# Patient Record
Sex: Male | Born: 1950 | Race: White | Hispanic: No | Marital: Married | Smoking: Former smoker
Health system: Southern US, Community
[De-identification: ages and names within clinical notes are randomized; demographics above are authoritative.]

## PROBLEM LIST (undated history)

## (undated) DIAGNOSIS — R519 Headache, unspecified: Secondary | ICD-10-CM

## (undated) DIAGNOSIS — I739 Peripheral vascular disease, unspecified: Secondary | ICD-10-CM

## (undated) DIAGNOSIS — M1990 Unspecified osteoarthritis, unspecified site: Secondary | ICD-10-CM

## (undated) DIAGNOSIS — I10 Essential (primary) hypertension: Secondary | ICD-10-CM

## (undated) DIAGNOSIS — T7840XA Allergy, unspecified, initial encounter: Secondary | ICD-10-CM

## (undated) DIAGNOSIS — F419 Anxiety disorder, unspecified: Secondary | ICD-10-CM

## (undated) DIAGNOSIS — W3400XA Accidental discharge from unspecified firearms or gun, initial encounter: Secondary | ICD-10-CM

## (undated) DIAGNOSIS — Z87442 Personal history of urinary calculi: Secondary | ICD-10-CM

## (undated) DIAGNOSIS — R51 Headache: Secondary | ICD-10-CM

## (undated) DIAGNOSIS — Z5189 Encounter for other specified aftercare: Secondary | ICD-10-CM

## (undated) HISTORY — PX: COLONOSCOPY: SHX5424

## (undated) HISTORY — PX: OTHER SURGICAL HISTORY: SHX169

## (undated) HISTORY — PX: COLONOSCOPY: SHX174

## (undated) HISTORY — PX: HERNIA REPAIR: SHX51

## (undated) HISTORY — DX: Accidental discharge from unspecified firearms or gun, initial encounter: W34.00XA

## (undated) HISTORY — DX: Headache, unspecified: R51.9

## (undated) HISTORY — PX: KIDNEY STONE SURGERY: SHX686

## (undated) HISTORY — PX: LITHOTRIPSY: SUR834

## (undated) HISTORY — PX: POLYPECTOMY: SHX149

## (undated) HISTORY — DX: Essential (primary) hypertension: I10

## (undated) HISTORY — DX: Unspecified osteoarthritis, unspecified site: M19.90

## (undated) HISTORY — DX: Allergy, unspecified, initial encounter: T78.40XA

## (undated) HISTORY — DX: Headache: R51

## (undated) HISTORY — DX: Encounter for other specified aftercare: Z51.89

## (undated) HISTORY — DX: Anxiety disorder, unspecified: F41.9

---

## 1988-03-30 DIAGNOSIS — W3400XA Accidental discharge from unspecified firearms or gun, initial encounter: Secondary | ICD-10-CM

## 1988-03-30 HISTORY — DX: Accidental discharge from unspecified firearms or gun, initial encounter: W34.00XA

## 2009-02-26 ENCOUNTER — Ambulatory Visit: Payer: Self-pay | Admitting: Urology

## 2009-09-20 ENCOUNTER — Ambulatory Visit: Payer: Self-pay | Admitting: Urology

## 2009-10-03 ENCOUNTER — Ambulatory Visit: Payer: Self-pay | Admitting: Urology

## 2010-03-23 IMAGING — CT CT ABD-PELV W/O CM
1 of 2 series · 15 of 32 positions shown, 19 images · non-contrast
Comparison: none

REASON FOR EXAM: hematuria
COMMENTS:

[Series 2: stone · axial · 0.84mm/px · z∈[-1558,-1110]mm · 15 of 163 slices shown, 19 images]
[im 7/163  soft-tissue]
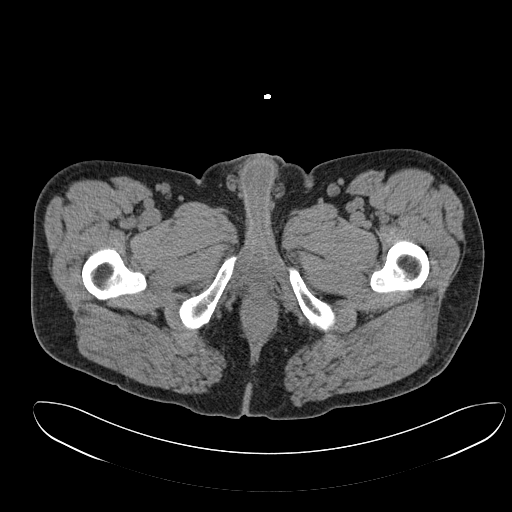
[im 7/163  bone]
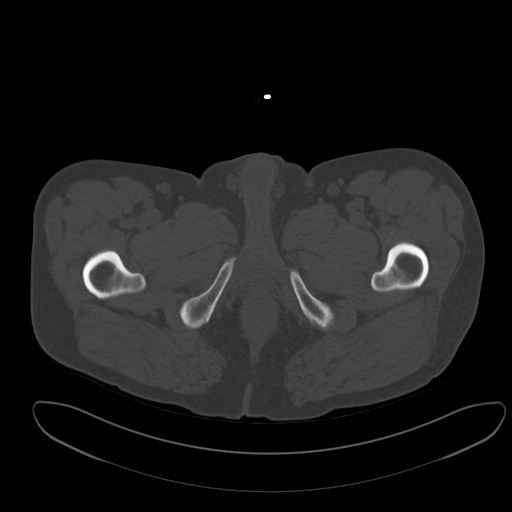
[im 21/163  soft-tissue]
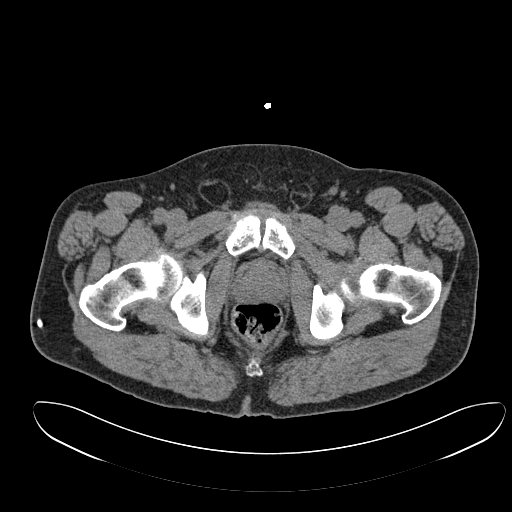
[im 34/163  soft-tissue]
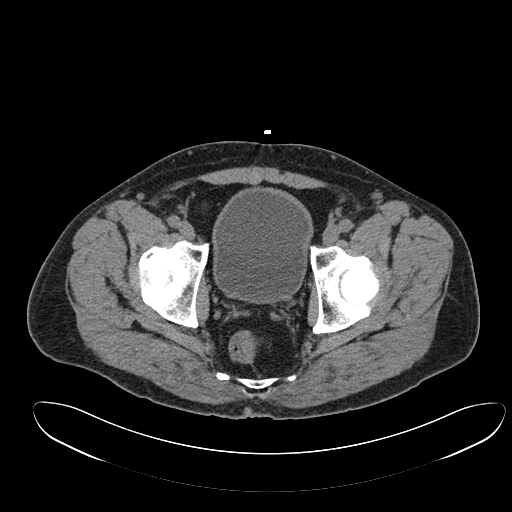
[im 48/163  soft-tissue]
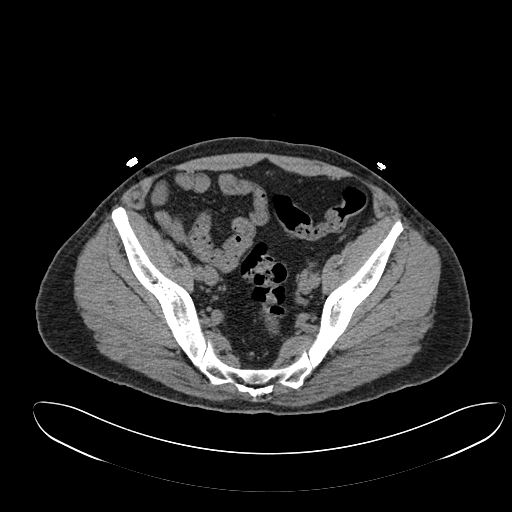
[im 55/163  soft-tissue]
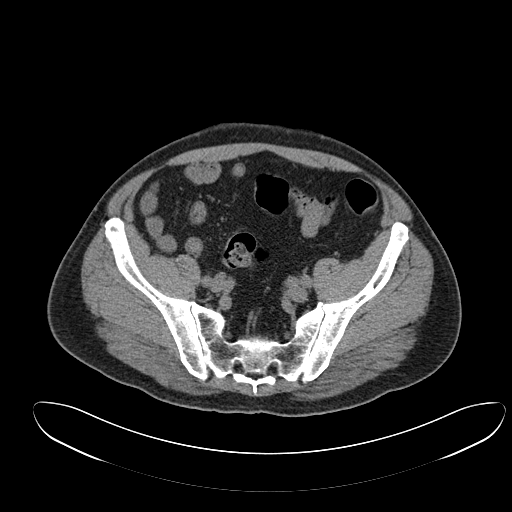
[im 68/163  soft-tissue]
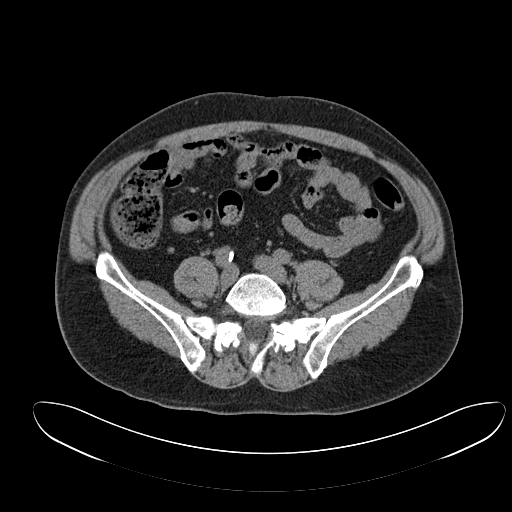
[im 82/163  soft-tissue]
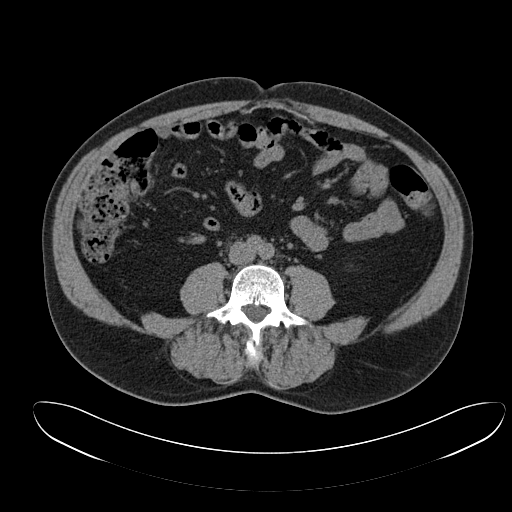
[im 95/163  soft-tissue]
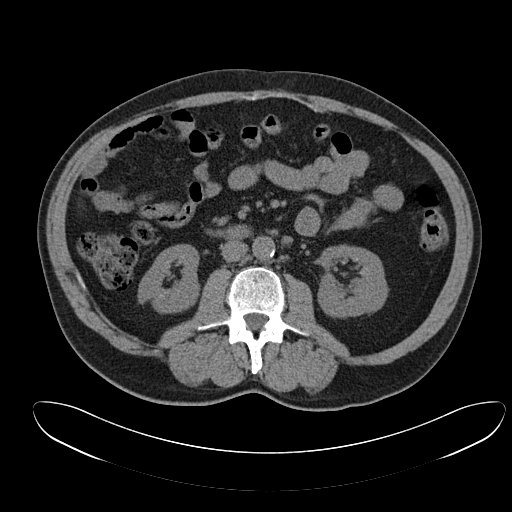
[im 109/163  soft-tissue]
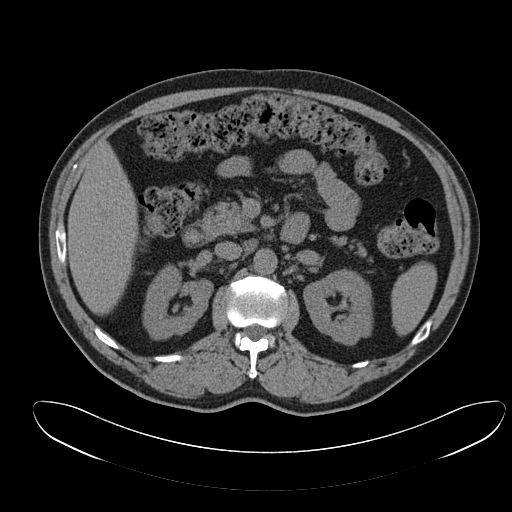
[im 109/163  bone]
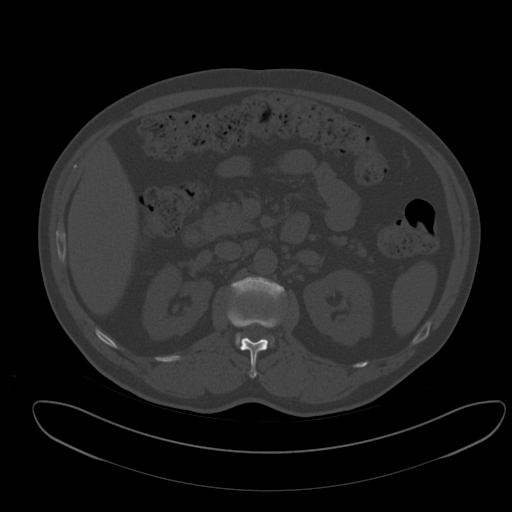
[im 115/163  soft-tissue]
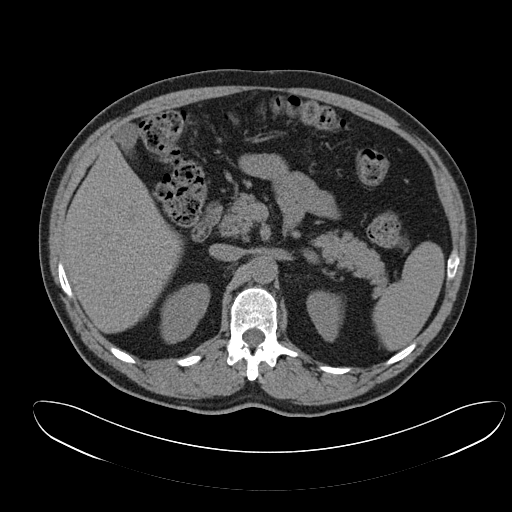
[im 129/163  soft-tissue]
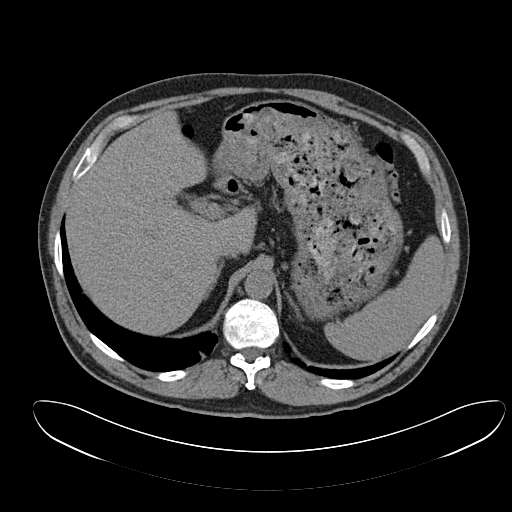
[im 136/163  lung]
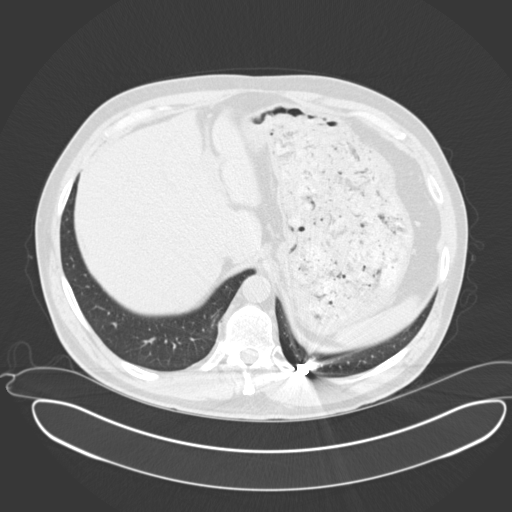
[im 142/163  soft-tissue]
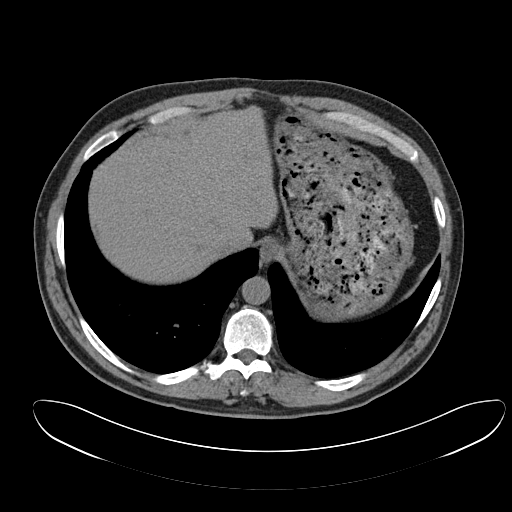
[im 142/163  lung]
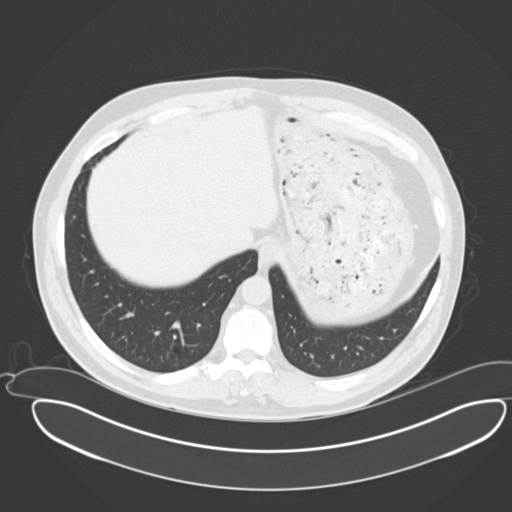
[im 149/163  lung]
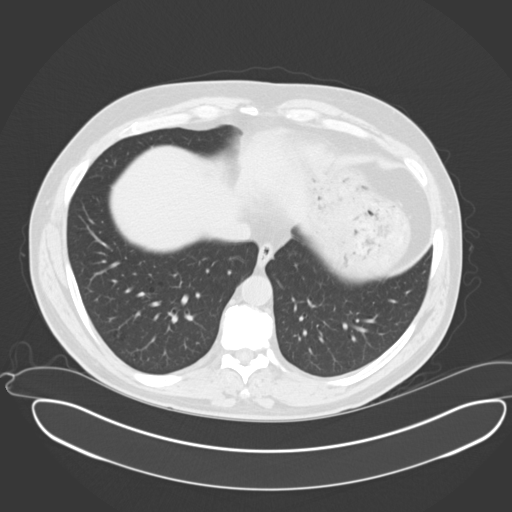
[im 156/163  soft-tissue]
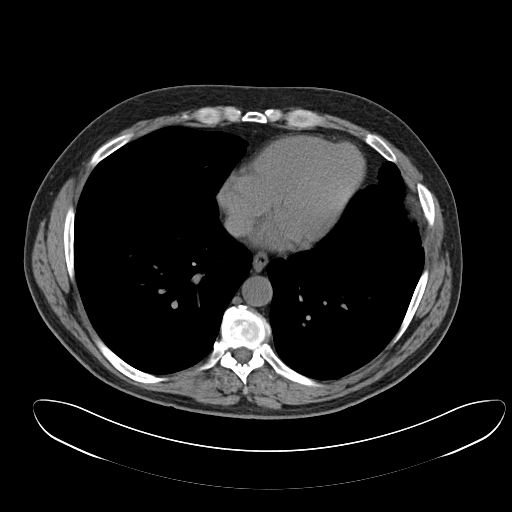
[im 156/163  lung]
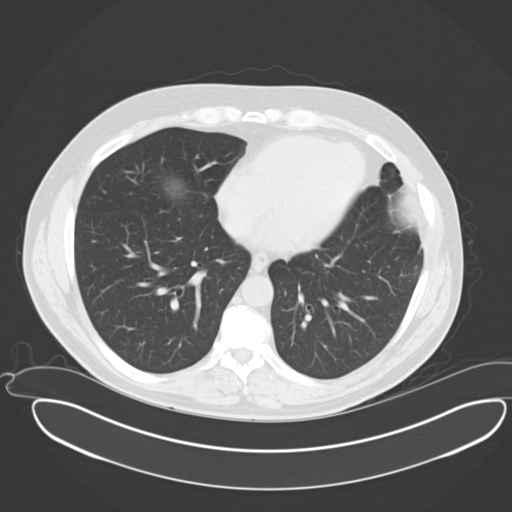

[15 of 32 positions shown; findings below may reference images not displayed]

PROCEDURE:     CT  - CT ABDOMEN AND PELVIS W[DATE]  [DATE]

RESULT:        Helical noncontrasted 3-mm sections were obtained from the
lung bases through the pubic symphysis.

Evaluation of the left lung base demonstrates a linear area of increased
density along the lateral periphery likely representing a pleural tag.
There is associated atelectasis and/or scarring.

Noncontrast evaluation of the liver, spleen, adrenals and pancreas are
unremarkable.  Evaluation of the right kidney demonstrates a nonobstructing
6.9 mm medullary calculus within the posterior midpole portion of the
kidney.  There is no evidence of hydronephrosis or gross evidence of masses
or further calculi are noted. Small 1 mm nonobstructing medullary calculi
are identified within the left kidney.  There is no evidence of
hydronephrosis, hydroureter or ureterolithiasis.  Note, the calculi within
the left kidney are within the posterior upper pole and posterior midpole
regions. No gross evidence of masses are identified. There is no secondary
signs reflecting the sequela of enteritis, colitis, diverticulitis or
appendicitis. The appendix is identified and is unremarkable. A large amount
of debris is appreciated within the stomach.
IMPRESSION: 1.     Nonobstructing bilateral calculi without evidence of hydronephrosis
or hydroureter.
2.     Otherwise, no further evidence of focal or acute abnormalities within
the limitations of a noncontrasted CT.

## 2011-09-04 ENCOUNTER — Ambulatory Visit: Payer: Self-pay | Admitting: Urology

## 2012-09-28 IMAGING — CR DG ABDOMEN 1V
1 series · 2 of 2 positions shown · non-contrast
Comparison: none

REASON FOR EXAM: Nephrolithiasis
COMMENTS:

[Series 1: view not recorded · 0.17mm/px · 2 of 2 slices shown]
[im 1/2]
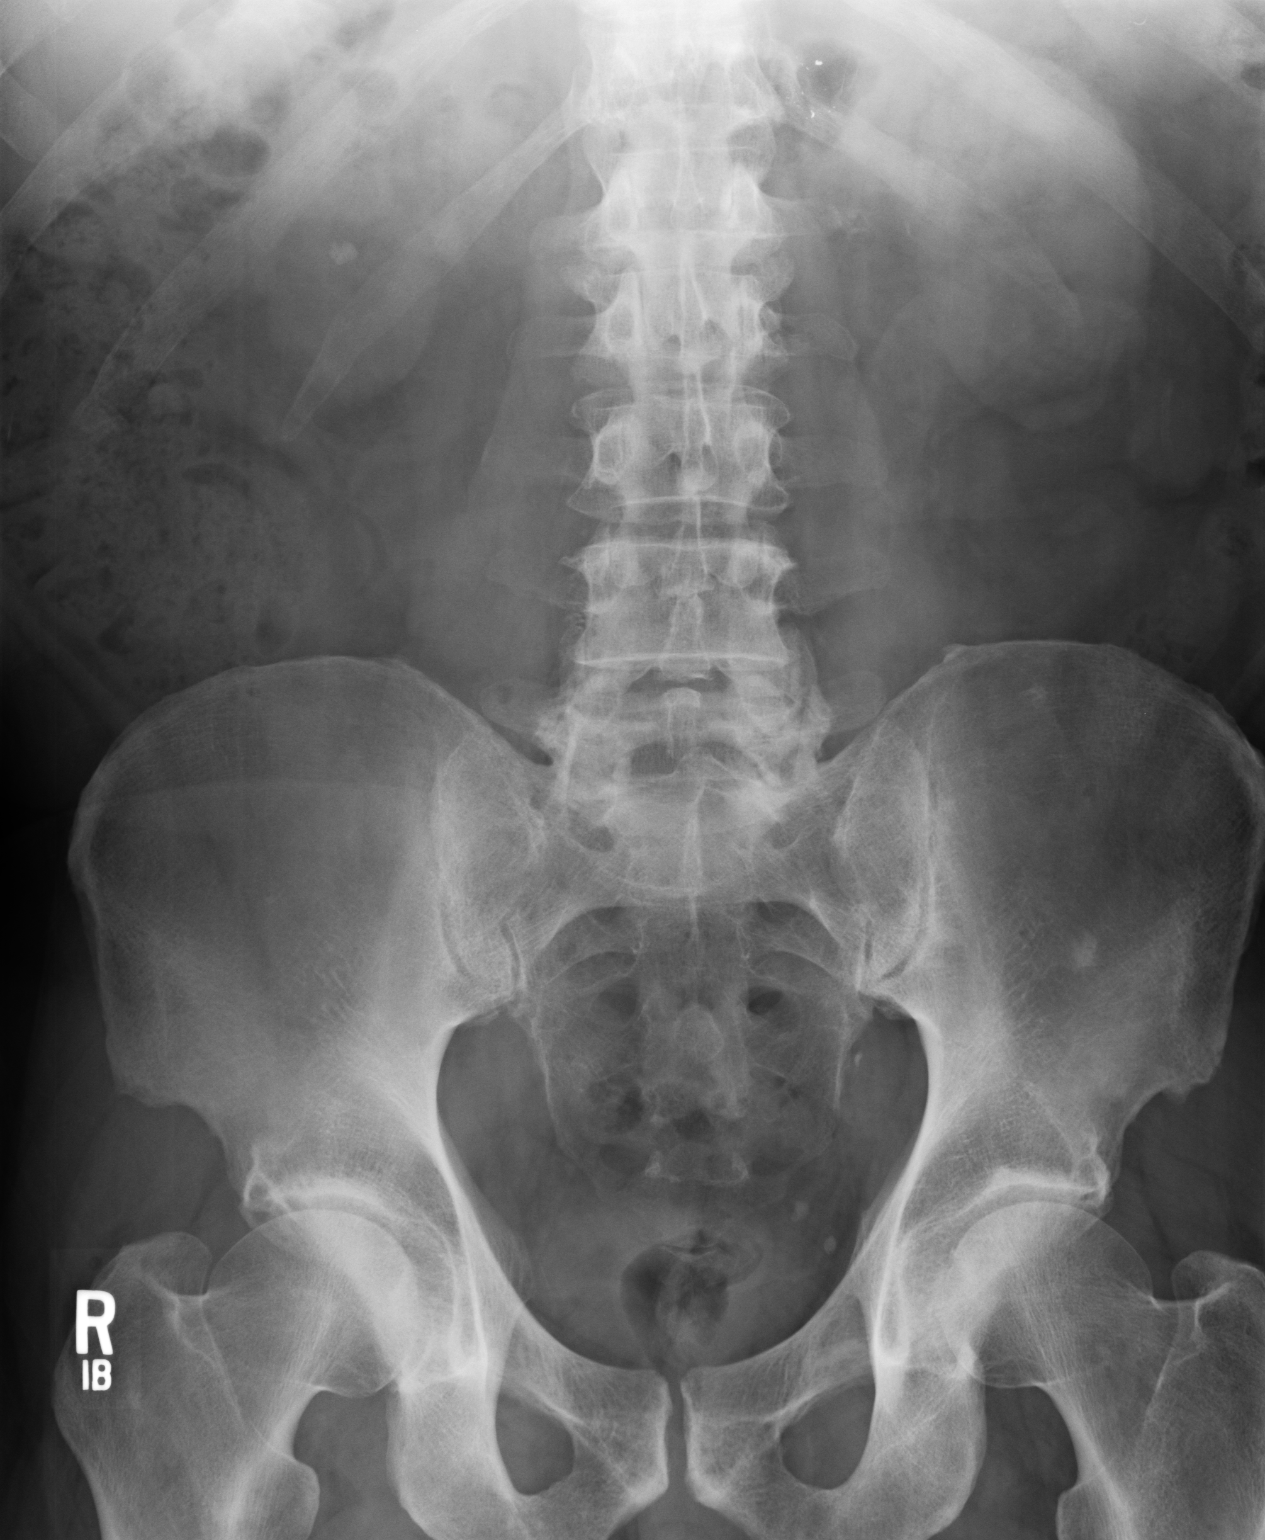
[im 2/2]
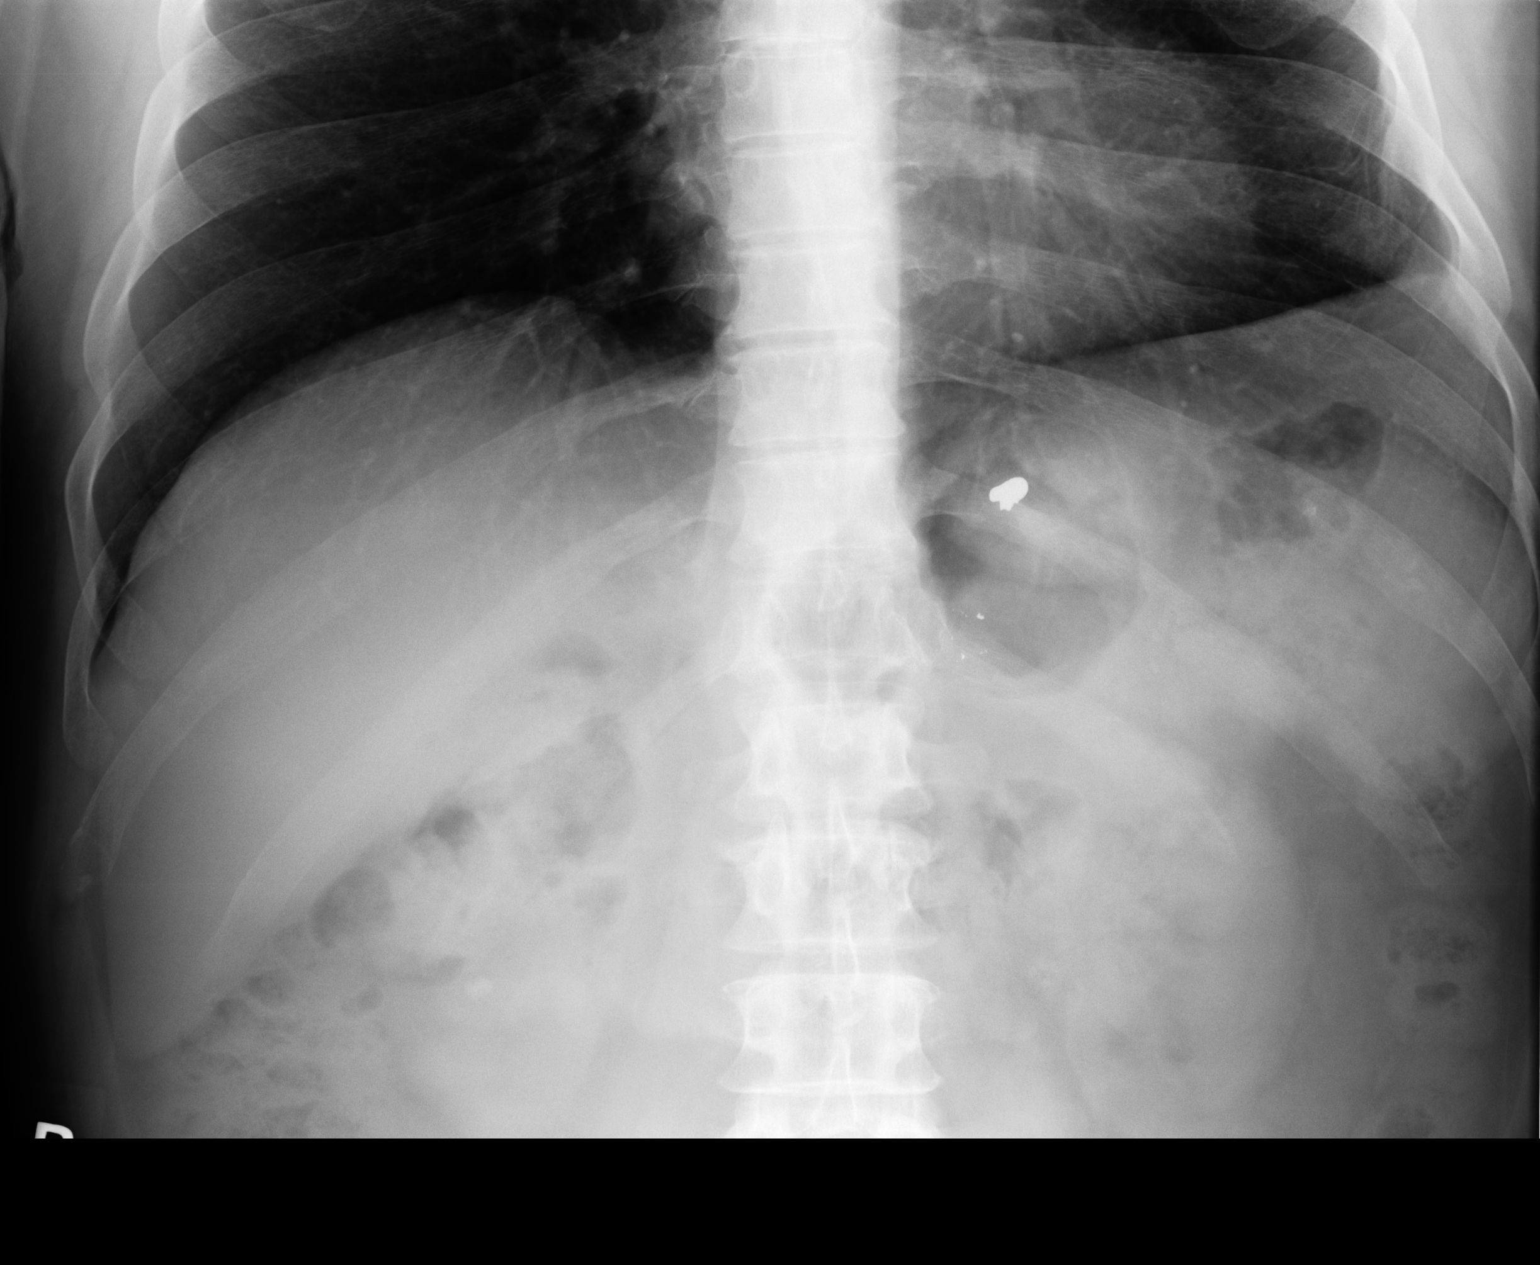

[2 of 2 positions shown; findings below may reference images not displayed]

PROCEDURE:     DXR - DXR KIDNEY URETER BLADDER  - September 04, 2011 [DATE]

RESULT:     Lower pole, large, right renal calculus is present. Numerous
calcific densities are seen in the left pelvic region. Tiny, lower pole,
left renal stone is suggested. Bony degenerative changes are present in the
spine.
IMPRESSION: 1.     Presumed phleboliths in the left pelvis.
2.     Bilateral nephrolithiasis.

## 2016-09-10 ENCOUNTER — Ambulatory Visit: Payer: Self-pay | Admitting: Allergy and Immunology

## 2016-11-13 ENCOUNTER — Ambulatory Visit (INDEPENDENT_AMBULATORY_CARE_PROVIDER_SITE_OTHER): Payer: BLUE CROSS/BLUE SHIELD | Admitting: Allergy and Immunology

## 2016-11-13 ENCOUNTER — Encounter: Payer: Self-pay | Admitting: Allergy and Immunology

## 2016-11-13 VITALS — BP 122/72 | HR 84 | Temp 98.7°F | Resp 20 | Ht 68.5 in | Wt 185.0 lb

## 2016-11-13 DIAGNOSIS — J3089 Other allergic rhinitis: Secondary | ICD-10-CM

## 2016-11-13 DIAGNOSIS — R635 Abnormal weight gain: Secondary | ICD-10-CM | POA: Diagnosis not present

## 2016-11-13 DIAGNOSIS — R51 Headache: Secondary | ICD-10-CM

## 2016-11-13 DIAGNOSIS — R519 Headache, unspecified: Secondary | ICD-10-CM

## 2016-11-13 MED ORDER — AZELASTINE HCL 0.1 % NA SOLN
1.0000 | Freq: Two times a day (BID) | NASAL | 5 refills | Status: DC
Start: 2016-11-13 — End: 2016-12-25

## 2016-11-13 MED ORDER — MONTELUKAST SODIUM 10 MG PO TABS: 10.0000 mg | ORAL_TABLET | Freq: Every day | ORAL | 5 refills | Status: DC
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
## 2016-11-13 NOTE — Patient Instructions (Addendum)
  1. Allergen avoidance measures  2. Every night utilize the following medications:   A. OTC Afrin one spray each nostril  B. OTC Rhinocort one spray each nostril  C. Azelastine one spray each nostril  D. montelukast 10 mg tablet  3. If needed:   A. nasal saline  B. OTC antihistamine  4. Consider possible sleep study  5. Check thyroid function tests and nut panel  6. Return to clinic in 4 weeks or earlier if problem

## 2016-11-19 ENCOUNTER — Other Ambulatory Visit: Payer: Self-pay | Admitting: Allergy and Immunology

## 2016-11-19 DIAGNOSIS — E069 Thyroiditis, unspecified: Secondary | ICD-10-CM

## 2016-11-19 DIAGNOSIS — Z91018 Allergy to other foods: Secondary | ICD-10-CM

## 2016-11-19 DIAGNOSIS — R635 Abnormal weight gain: Secondary | ICD-10-CM

## 2016-12-11 ENCOUNTER — Encounter: Payer: Self-pay | Admitting: Allergy and Immunology

## 2016-12-11 ENCOUNTER — Ambulatory Visit (INDEPENDENT_AMBULATORY_CARE_PROVIDER_SITE_OTHER): Payer: BLUE CROSS/BLUE SHIELD | Admitting: Allergy and Immunology

## 2016-12-11 VITALS — BP 142/88 | HR 64 | Resp 16

## 2016-12-11 DIAGNOSIS — E291 Testicular hypofunction: Secondary | ICD-10-CM

## 2016-12-11 DIAGNOSIS — R635 Abnormal weight gain: Secondary | ICD-10-CM

## 2016-12-11 DIAGNOSIS — R0683 Snoring: Secondary | ICD-10-CM

## 2016-12-11 DIAGNOSIS — J3089 Other allergic rhinitis: Secondary | ICD-10-CM

## 2016-12-11 DIAGNOSIS — R51 Headache: Secondary | ICD-10-CM

## 2016-12-11 NOTE — Patient Instructions (Addendum)
  1. Allergen avoidance measures  2. Every night utilize the following medications:   A. OTC Afrin one spray alternating nostril per night  B. OTC Rhinocort one spray each nostril per night  C. Azelastine one spray each nostril per night  D. montelukast 10 mg tablet  3. If needed:   A. nasal saline  B. OTC antihistamine  4. [facility]  5. Evaluation with a sleep study  6. Check thyroid function tests and nut panel  7. Consider restarting testosterone cream at 50% dosage  8. Return to clinic in 6 months or earlier if problem
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
## 2016-12-12 ENCOUNTER — Other Ambulatory Visit: Payer: Self-pay | Admitting: Allergy and Immunology

## 2016-12-12 DIAGNOSIS — R519 Headache, unspecified: Secondary | ICD-10-CM

## 2016-12-12 DIAGNOSIS — R5383 Other fatigue: Secondary | ICD-10-CM

## 2016-12-12 DIAGNOSIS — R42 Dizziness and giddiness: Secondary | ICD-10-CM

## 2016-12-12 DIAGNOSIS — R51 Headache: Principal | ICD-10-CM

## 2016-12-12 DIAGNOSIS — R400 Somnolence: Secondary | ICD-10-CM

## 2016-12-19 NOTE — Addendum Note (Signed)
Addended by: Redge GainerHOMPSON, Tashanti Dalporto N on: 12/19/2016 09:00 AM   Modules accepted: Orders

## 2016-12-25 ENCOUNTER — Ambulatory Visit (INDEPENDENT_AMBULATORY_CARE_PROVIDER_SITE_OTHER): Payer: BLUE CROSS/BLUE SHIELD | Admitting: Neurology

## 2016-12-25 ENCOUNTER — Encounter: Payer: Self-pay | Admitting: Neurology

## 2016-12-25 VITALS — BP 112/76 | HR 72 | Resp 16 | Ht 68.5 in | Wt 182.0 lb

## 2016-12-25 DIAGNOSIS — I651 Occlusion and stenosis of basilar artery: Secondary | ICD-10-CM

## 2016-12-25 DIAGNOSIS — R635 Abnormal weight gain: Secondary | ICD-10-CM

## 2016-12-25 DIAGNOSIS — Z889 Allergy status to unspecified drugs, medicaments and biological substances status: Secondary | ICD-10-CM

## 2016-12-25 DIAGNOSIS — H539 Unspecified visual disturbance: Secondary | ICD-10-CM

## 2016-12-25 DIAGNOSIS — I6509 Occlusion and stenosis of unspecified vertebral artery: Secondary | ICD-10-CM

## 2016-12-25 DIAGNOSIS — R42 Dizziness and giddiness: Secondary | ICD-10-CM

## 2016-12-25 DIAGNOSIS — G2581 Restless legs syndrome: Secondary | ICD-10-CM

## 2016-12-25 DIAGNOSIS — R0981 Nasal congestion: Secondary | ICD-10-CM

## 2016-12-25 MED ORDER — GABAPENTIN 300 MG PO CAPS: ORAL_CAPSULE | ORAL | 11 refills | Status: DC
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
## 2016-12-25 NOTE — Patient Instructions (Addendum)
Remember to drink plenty of fluid, eat healthy meals and do not skip any meals. Try to eat protein with a every meal and eat a healthy snack such as fruit or nuts in between meals. Try to keep a regular sleep-wake schedule and try to exercise daily, particularly in the form of walking, 20-30 minutes a day, if you can.   As far as your medications are concerned, I would like to suggest: Continue meclizine. Take Xanax 30 minutes before MRI (do not drive). Gabapentin at bedtime.   As far as diagnostic testing: MRI of the brain and blood vessels of the head, Vestibular therapy  I would like to see you back after vestibular therapy, sooner if we need to. Please call us with any interim questions, concerns, problems, updates or refill requests.   Our phone number is [phone]. We also have an after hours call service for urgent matters and there is a physician on-call for urgent questions. For any emergencies you know to call 911 or go to the nearest emergency room   Benign Positional Vertigo Introduction Vertigo is the feeling that you or your surroundings are moving when they are not. Benign positional vertigo is the most common form of vertigo. The cause of this condition is not serious (is benign). This condition is triggered by certain movements and positions (is positional). This condition can be dangerous if it occurs while you are doing something that could endanger you or others, such as driving. What are the causes? In many cases, the cause of this condition is not known. It may be caused by a disturbance in an area of the inner ear that helps your brain to sense movement and balance. This disturbance can be caused by a viral infection (labyrinthitis), head injury, or repetitive motion. What increases the risk? This condition is more likely to develop in:  Women.  People who are 80 years of age or older. What are the signs or symptoms? Symptoms of this condition usually happen when you  move your head or your eyes in different directions. Symptoms may start suddenly, and they usually last for less than a minute. Symptoms may include:  Loss of balance and falling.  Feeling like you are spinning or moving.  Feeling like your surroundings are spinning or moving.  Nausea and vomiting.  Blurred vision.  Dizziness.  Involuntary eye movement (nystagmus). Symptoms can be mild and cause only slight annoyance, or they can be severe and interfere with daily life. Episodes of benign positional vertigo may return (recur) over time, and they may be triggered by certain movements. Symptoms may improve over time. How is this diagnosed? This condition is usually diagnosed by medical history and a physical exam of the head, neck, and ears. You may be referred to a health care provider who specializes in ear, nose, and throat (ENT) problems (otolaryngologist) or a provider who specializes in disorders of the nervous system (neurologist). You may have additional testing, including:  MRI.  A CT scan.  Eye movement tests. Your health care provider may ask you to change positions quickly while he or she watches you for symptoms of benign positional vertigo, such as nystagmus. Eye movement may be tested with an electronystagmogram (ENG), caloric stimulation, the Dix-Hallpike test, or the roll test.  An electroencephalogram (EEG). This records electrical activity in your brain.  Hearing tests. How is this treated? Usually, your health care provider will treat this by moving your head in specific positions to adjust your inner ear  back to normal. Surgery may be needed in severe cases, but this is rare. In some cases, benign positional vertigo may resolve on its own in 2-4 weeks. Follow these instructions at home: Safety  Move slowly.Avoid sudden body or head movements.  Avoid driving.  Avoid operating heavy machinery.  Avoid doing any tasks that would be dangerous to you or others if a  vertigo episode would occur.  If you have trouble walking or keeping your balance, try using a cane for stability. If you feel dizzy or unstable, sit down right away.  Return to your normal activities as told by your health care provider. Ask your health care provider what activities are safe for you. General instructions  Take over-the-counter and prescription medicines only as told by your health care provider.  Avoid certain positions or movements as told by your health care provider.  Drink enough fluid to keep your urine clear or pale yellow.  Keep all follow-up visits as told by your health care provider. This is important. Contact a health care provider if:  You have a fever.  Your condition gets worse or you develop new symptoms.  Your family or friends notice any behavioral changes.  Your nausea or vomiting gets worse.  You have numbness or a "pins and needles" sensation. Get help right away if:  You have difficulty speaking or moving.  You are always dizzy.  You faint.  You develop severe headaches.  You have weakness in your legs or arms.  You have changes in your hearing or vision.  You develop a stiff neck.  You develop sensitivity to light. This information is not intended to replace advice given to you by your health care provider. Make sure you discuss any questions you have with your health care provider. Document Released: 08/18/2006 Document Revised: 04/17/2016 Document Reviewed: 03/05/2015  2017 Elsevier  Gabapentin capsules or tablets What is this medicine? GABAPENTIN (GA ba pen tin) is used to control partial seizures in adults with epilepsy. It is also used to treat certain types of nerve pain. This medicine may be used for other purposes; ask your health care provider or pharmacist if you have questions. COMMON BRAND NAME(S): Active-PAC with Gabapentin, Gabarone, Neurontin What should I tell my health care provider before I take this  medicine? They need to know if you have any of these conditions: -kidney disease -suicidal thoughts, plans, or attempt; a previous suicide attempt by you or a family member -an unusual or allergic reaction to gabapentin, other medicines, foods, dyes, or preservatives -pregnant or trying to get pregnant -breast-feeding How should I use this medicine? Take this medicine by mouth with a glass of water. Follow the directions on the prescription label. You can take it with or without food. If it upsets your stomach, take it with food.Take your medicine at regular intervals. Do not take it more often than directed. Do not stop taking except on your doctor's advice. If you are directed to break the 600 or 800 mg tablets in half as part of your dose, the extra half tablet should be used for the next dose. If you have not used the extra half tablet within 28 days, it should be thrown away. A special MedGuide will be given to you by the pharmacist with each prescription and refill. Be sure to read this information carefully each time. Talk to your pediatrician regarding the use of this medicine in children. Special care may be needed. Overdosage: If you think you have  taken too much of this medicine contact a poison control center or emergency room at once. NOTE: This medicine is only for you. Do not share this medicine with others. What if I miss a dose? If you miss a dose, take it as soon as you can. If it is almost time for your next dose, take only that dose. Do not take double or extra doses. What may interact with this medicine? Do not take this medicine with any of the following medications: -other gabapentin products This medicine may also interact with the following medications: -alcohol -antacids -antihistamines for allergy, cough and cold -certain medicines for anxiety or sleep -certain medicines for depression or psychotic disturbances -homatropine; hydrocodone -naproxen -narcotic  medicines (opiates) for pain -phenothiazines like chlorpromazine, mesoridazine, prochlorperazine, thioridazine This list may not describe all possible interactions. Give your health care provider a list of all the medicines, herbs, non-prescription drugs, or dietary supplements you use. Also tell them if you smoke, drink alcohol, or use illegal drugs. Some items may interact with your medicine. What should I watch for while using this medicine? Visit your doctor or health care professional for regular checks on your progress. You may want to keep a record at home of how you feel your condition is responding to treatment. You may want to share this information with your doctor or health care professional at each visit. You should contact your doctor or health care professional if your seizures get worse or if you have any new types of seizures. Do not stop taking this medicine or any of your seizure medicines unless instructed by your doctor or health care professional. Stopping your medicine suddenly can increase your seizures or their severity. Wear a medical identification bracelet or chain if you are taking this medicine for seizures, and carry a card that lists all your medications. You may get drowsy, dizzy, or have blurred vision. Do not drive, use machinery, or do anything that needs mental alertness until you know how this medicine affects you. To reduce dizzy or fainting spells, do not sit or stand up quickly, especially if you are an older patient. Alcohol can increase drowsiness and dizziness. Avoid alcoholic drinks. Your mouth may get dry. Chewing sugarless gum or sucking hard candy, and drinking plenty of water will help. The use of this medicine may increase the chance of suicidal thoughts or actions. Pay special attention to how you are responding while on this medicine. Any worsening of mood, or thoughts of suicide or dying should be reported to your health care professional right  away. Women who become pregnant while using this medicine may enroll in the [place] American Antiepileptic Drug Pregnancy Registry by calling [phone]. This registry collects information about the safety of antiepileptic drug use during pregnancy. What side effects may I notice from receiving this medicine? Side effects that you should report to your doctor or health care professional as soon as possible: -allergic reactions like skin rash, itching or hives, swelling of the face, lips, or tongue -worsening of mood, thoughts or actions of suicide or dying Side effects that usually do not require medical attention (report to your doctor or health care professional if they continue or are bothersome): -constipation -difficulty walking or controlling muscle movements -dizziness -nausea -slurred speech -tiredness -tremors -weight gain This list may not describe all possible side effects. Call your doctor for medical advice about side effects. You may report side effects to FDA at 1-800-FDA-1088. Where should I keep my medicine? Keep out of  reach of children. This medicine may cause accidental overdose and death if it taken by other adults, children, or pets. Mix any unused medicine with a substance like cat litter or coffee grounds. Then throw the medicine away in a sealed container like a sealed bag or a coffee can with a lid. Do not use the medicine after the expiration date. Store at room temperature between 15 and 30 degrees C (59 and 86 degrees F). NOTE: This sheet is a summary. It may not cover all possible information. If you have questions about this medicine, talk to your doctor, pharmacist, or health care provider.  2017 Elsevier/Gold Standard (2014-01-06 15:26:50)

## 2016-12-26 ENCOUNTER — Telehealth: Payer: Self-pay | Admitting: Neurology

## 2016-12-26 NOTE — Telephone Encounter (Signed)
[employer] did not approve the exam's. The phone number for the peer to peer is [phone]. The member ID is [id number] DOB is 2051-07-28. Please do in 2 business days.. Thank you for your help.

## 2016-12-29 NOTE — Telephone Encounter (Signed)
They will approve the MRI brain first and then the MRA if this is negative. They will not approve both ubfortunately so just tel patient we may have to do them separately. [id number]. Valid throught 01/24/2017.

## 2016-12-29 NOTE — Telephone Encounter (Signed)
Noted, thank you for your help. I will inform the patient.

## 2016-12-30 LAB — COMPREHENSIVE METABOLIC PANEL
ALBUMIN: 4.3 g/dL (ref 3.6–4.8)
ALK PHOS: 88 IU/L (ref 39–117)
ALT: 17 IU/L (ref 0–44)
AST: 14 IU/L (ref 0–40)
Albumin/Globulin Ratio: 2 (ref 1.2–2.2)
BILIRUBIN TOTAL: 0.6 mg/dL (ref 0.0–1.2)
BUN/Creatinine Ratio: 21 (ref 10–24)
BUN: 19 mg/dL (ref 8–27)
CHLORIDE: 104 mmol/L (ref 96–106)
CO2: 25 mmol/L (ref 18–29)
Calcium: 9.7 mg/dL (ref 8.6–10.2)
Creatinine, Ser: 0.9 mg/dL (ref 0.76–1.27)
GFR calc Af Amer: 104 mL/min/{1.73_m2} (ref >59)
GFR calc non Af Amer: 90 mL/min/{1.73_m2} (ref >59)
GLUCOSE: 69 mg/dL (ref 65–99)
Globulin, Total: 2.1 g/dL (ref 1.5–4.5)
Potassium: 4.9 mmol/L (ref 3.5–5.2)
SODIUM: 142 mmol/L (ref 134–144)
Total Protein: 6.4 g/dL (ref 6.0–8.5)

## 2016-12-30 LAB — CBC
Hematocrit: 46 % (ref 37.5–51.0)
Hemoglobin: 15.4 g/dL (ref 13.0–17.7)
MCH: 32.2 pg (ref 26.6–33.0)
MCHC: 33.5 g/dL (ref 31.5–35.7)
MCV: 96 fL (ref 79–97)
PLATELETS: 192 10*3/uL (ref 150–379)
RBC: 4.78 x10E6/uL (ref 4.14–5.80)
RDW: 12.6 % (ref 12.3–15.4)
WBC: 4.4 10*3/uL (ref 3.4–10.8)

## 2016-12-30 LAB — ALLERGENS(7): Peanut IgE: <0.1 kU/L

## 2016-12-30 LAB — TSH: TSH: 1.03 u[IU]/mL (ref 0.450–4.500)

## 2016-12-30 LAB — T4, FREE: FREE T4: 0.94 ng/dL (ref 0.82–1.77)

## 2016-12-31 ENCOUNTER — Telehealth: Payer: Self-pay

## 2016-12-31 NOTE — Telephone Encounter (Signed)
Per Dr. Lucia GaskinsAhern... Allegrens look to be all negative but he should follow up with the doctor who originally ordered it.

## 2016-12-31 NOTE — Telephone Encounter (Signed)
-----   Message from Anson FretAntonia B Ahern, MD sent at 12/30/2016 10:03 AM EST ----- His labs look fine. The only lab that has not come back is the allergens and that is one the other doctor has to interpret so when that comes back I will ask him to follow up with the ordering doctor thanks

## 2016-12-31 NOTE — Addendum Note (Signed)
Addended by: Naomie DeanAHERN, Gissella Niblack B on: 12/31/2016 01:54 PM   Modules accepted: Orders

## 2016-12-31 NOTE — Telephone Encounter (Signed)
Called pt w/ lab results. Verbalized understanding and appreciation for call.  Says that he has a hx of a sliver of metal being removed from his eye so MRI coordinator told him that he would need an xray first. However, pt says that he would rather decline MRI as he really doesn't have the time for multiple appts and his HAs have improved some w/ Effexor. Also, insurance will only cover MRI brain and not MRA head. He is amenable to having a CT instead if recommended by MD and covered by insurance.

## 2016-12-31 NOTE — Telephone Encounter (Signed)
CT is better than nothing, thanks I wil lorder

## 2017-01-08 ENCOUNTER — Telehealth: Payer: Self-pay | Admitting: Allergy and Immunology

## 2017-01-08 NOTE — Telephone Encounter (Signed)
The [facility] called and stated [employer] will only approve the study to be preformed at home.  The center needs a new order for this.

## 2017-01-08 NOTE — Telephone Encounter (Signed)
New order faxed. 

## 2017-03-05 ENCOUNTER — Telehealth: Payer: Self-pay

## 2017-03-05 NOTE — Telephone Encounter (Signed)
Received faxed d/c summary from [facility]. Placed in MD box for review.

## 2017-08-21 ENCOUNTER — Other Ambulatory Visit: Payer: Self-pay

## 2017-08-21 DIAGNOSIS — I6521 Occlusion and stenosis of right carotid artery: Secondary | ICD-10-CM

## 2017-09-04 ENCOUNTER — Ambulatory Visit (HOSPITAL_COMMUNITY)
Admission: RE | Admit: 2017-09-04 | Discharge: 2017-09-04 | Disposition: A | Discharge status: Home / Self Care | Payer: BLUE CROSS/BLUE SHIELD | Source: Ambulatory Visit | Attending: Vascular Surgery | Admitting: Vascular Surgery

## 2017-09-04 ENCOUNTER — Ambulatory Visit (INDEPENDENT_AMBULATORY_CARE_PROVIDER_SITE_OTHER): Payer: BLUE CROSS/BLUE SHIELD | Admitting: Vascular Surgery

## 2017-09-04 ENCOUNTER — Encounter: Payer: Self-pay | Admitting: Vascular Surgery

## 2017-09-04 VITALS — BP 126/85 | HR 69 | Temp 98.1°F | Resp 16 | Ht 69.0 in | Wt 188.2 lb

## 2017-09-04 DIAGNOSIS — I6521 Occlusion and stenosis of right carotid artery: Secondary | ICD-10-CM

## 2017-09-04 LAB — VAS US CAROTID
RCCADSYS: -80 cm/s
RCCAPDIAS: 30 cm/s
RCCAPSYS: 98 cm/s
RIGHT CCA MID DIAS: 28 cm/s
RIGHT ECA DIAS: 22 cm/s
RIGHT VERTEBRAL DIAS: -16 cm/s
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
## 2017-09-08 ENCOUNTER — Encounter: Payer: Self-pay | Admitting: Cardiology

## 2017-09-08 ENCOUNTER — Telehealth (HOSPITAL_COMMUNITY): Payer: Self-pay

## 2017-09-08 ENCOUNTER — Ambulatory Visit (INDEPENDENT_AMBULATORY_CARE_PROVIDER_SITE_OTHER): Payer: BLUE CROSS/BLUE SHIELD | Admitting: Cardiology

## 2017-09-08 VITALS — BP 120/86 | HR 65 | Ht 68.0 in | Wt 188.0 lb

## 2017-09-08 DIAGNOSIS — R0602 Shortness of breath: Secondary | ICD-10-CM

## 2017-09-08 DIAGNOSIS — Z13220 Encounter for screening for lipoid disorders: Secondary | ICD-10-CM

## 2017-09-08 DIAGNOSIS — Z01810 Encounter for preprocedural cardiovascular examination: Secondary | ICD-10-CM | POA: Diagnosis not present

## 2017-09-08 NOTE — Telephone Encounter (Signed)
Encounter complete. 

## 2017-09-08 NOTE — Patient Instructions (Signed)
Medication Instructions:  Continue current medications  If you need a refill on your cardiac medications before your next appointment, please call your pharmacy.  Labwork: Fasting Lipids HERE IN OUR OFFICE AT [facility]  Testing/Procedures: Your physician has requested that you have an exercise tolerance test. For further information please visit [email or web address]. Please also follow instruction sheet, as given.  Follow-Up: Your physician wants you to follow-up in: As Needed.    Thank you for choosing [facility]!!      

## 2017-09-09 ENCOUNTER — Encounter: Payer: Self-pay | Admitting: Cardiology

## 2017-09-09 DIAGNOSIS — R0602 Shortness of breath: Secondary | ICD-10-CM | POA: Insufficient documentation

## 2017-09-09 DIAGNOSIS — Z13220 Encounter for screening for lipoid disorders: Secondary | ICD-10-CM | POA: Insufficient documentation

## 2017-09-10 ENCOUNTER — Ambulatory Visit (HOSPITAL_COMMUNITY)
Admission: RE | Admit: 2017-09-10 | Discharge: 2017-09-10 | Disposition: A | Discharge status: Home / Self Care | Payer: BLUE CROSS/BLUE SHIELD | Source: Ambulatory Visit | Attending: Cardiology | Admitting: Cardiology

## 2017-09-10 DIAGNOSIS — R0602 Shortness of breath: Secondary | ICD-10-CM | POA: Insufficient documentation

## 2017-09-10 LAB — LIPID PANEL
CHOL/HDL RATIO: 5.3 ratio — AB (ref 0.0–5.0)
Cholesterol, Total: 200 mg/dL — ABNORMAL HIGH (ref 100–199)
HDL: 38 mg/dL — AB (ref >39)
LDL CALC: 128 mg/dL — AB (ref 0–99)
Triglycerides: 168 mg/dL — ABNORMAL HIGH (ref 0–149)
VLDL CHOLESTEROL CAL: 34 mg/dL (ref 5–40)

## 2017-09-10 LAB — EXERCISE TOLERANCE TEST
CHL CUP RESTING HR STRESS: 71 {beats}/min
CSEPED: 8 min
CSEPEW: 10.1 METS
CSEPPHR: 141 {beats}/min
Exercise duration (sec): 42 s
MPHR: 156 {beats}/min
Percent HR: 90 %
RPE: 18

## 2017-09-16 ENCOUNTER — Telehealth: Payer: Self-pay | Admitting: Cardiology

## 2017-09-16 MED ORDER — LOSARTAN POTASSIUM 50 MG PO TABS: 50.0000 mg | ORAL_TABLET | Freq: Every day | ORAL | 5 refills | Status: DC

## 2017-09-16 MED ORDER — PRAVASTATIN SODIUM 40 MG PO TABS: 40.0000 mg | ORAL_TABLET | Freq: Every evening | ORAL | 5 refills | Status: DC

## 2017-09-16 NOTE — Telephone Encounter (Signed)
New message     Pt wife is calling to get his lab results and testing results

## 2017-09-16 NOTE — Telephone Encounter (Signed)
Notes recorded by Rollene RotundaHochrein, James, MD on 09/16/2017 at 7:58 AM EDT He should be on Pravastatin 40 mg daily. Call Mr. Brendia Sacksritchard with the results and send results to Lise AuerKhan, Jaber A, MD  Notes recorded by Rollene RotundaHochrein, James, MD on 09/16/2017 at 7:57 AM EDT No evidence of ischemia. Call Mr. Brendia Sacksritchard with the results and send results to Lise AuerKhan, Jaber A, MD

## 2017-09-16 NOTE — Telephone Encounter (Signed)
Returned call to wife (DPR).  Notified her of lab + GXT results Pravastatin ordered per lab results, losartan ordered per last MD note (was not sent in at time of appt) Results forwarded to PCP and Dr. Randie Heinzain ([facility]) Wife aware patient is to f/up PRN and should seek follow up on BP and cholesterol from PCP Can see Dr. Antoine PocheHochrein in future if cardiac needs arise, aware if seen greater than 3 years from now, will be a new patient

## 2017-09-17 ENCOUNTER — Other Ambulatory Visit: Payer: Self-pay | Admitting: *Deleted

## 2017-09-18 ENCOUNTER — Encounter (HOSPITAL_COMMUNITY): Payer: Self-pay | Admitting: *Deleted

## 2017-09-18 NOTE — Progress Notes (Signed)
Mr Jose Wolf denies chesty pain or shortness of breath.  I instructed patient to stop taking Zyrtec D and nasal spray and Vitamins, fish oil and Melotoin.

## 2017-09-20 ENCOUNTER — Encounter (HOSPITAL_COMMUNITY): Payer: Self-pay | Admitting: Certified Registered Nurse Anesthetist

## 2017-09-20 NOTE — Anesthesia Preprocedure Evaluation (Addendum)
Anesthesia Evaluation  Patient identified by MRN, date of birth, ID band Patient awake    Reviewed: Allergy & Precautions, NPO status , Patient's Chart, lab work & pertinent test results  Airway Mallampati: I  TM Distance: >3 FB Neck ROM: Full    Dental  (+) Dental Advisory Given, Teeth Intact   Pulmonary former smoker,    Pulmonary exam normal breath sounds clear to auscultation       Cardiovascular Exercise Tolerance: Good hypertension, + Peripheral Vascular Disease  Normal cardiovascular exam Rhythm:Regular Rate:Normal  Carotid US 2018 - 80-99% Rt ICAS  Exercise Stress Test - 10 METS achieved, negative for ischemia   Neuro/Psych  Headaches, Anxiety    GI/Hepatic negative GI ROS, Neg liver ROS,   Endo/Other  negative endocrine ROS  Renal/GU Hx nephrolithiasis  negative genitourinary   Musculoskeletal negative musculoskeletal ROS (+)   Abdominal   Peds  Hematology negative hematology ROS (+)   Anesthesia Other Findings   Reproductive/Obstetrics                            Anesthesia Physical Anesthesia Plan  ASA: III  Anesthesia Plan: General   Post-op Pain Management:    Induction: Intravenous  PONV Risk Score and Plan: 3 and Ondansetron, Dexamethasone, Midazolam, Treatment may vary due to age or medical condition and Scopolamine patch - Pre-op  Airway Management Planned: Oral ETT  Additional Equipment: Arterial line  Intra-op Plan:   Post-operative Plan: Extubation in OR  Informed Consent: I have reviewed the patients History and Physical, chart, labs and discussed the procedure including the risks, benefits and alternatives for the proposed anesthesia with the patient or authorized representative who has indicated his/her understanding and acceptance.   Dental advisory given  Plan Discussed with: CRNA  Anesthesia Plan Comments:       Anesthesia Quick  Evaluation

## 2017-09-21 ENCOUNTER — Encounter (HOSPITAL_COMMUNITY)
Admission: RE | Disposition: A | Discharge status: Home / Self Care | Payer: Self-pay | Source: Ambulatory Visit | Attending: Vascular Surgery

## 2017-09-21 ENCOUNTER — Encounter (HOSPITAL_COMMUNITY): Payer: Self-pay | Admitting: *Deleted

## 2017-09-21 ENCOUNTER — Inpatient Hospital Stay (HOSPITAL_COMMUNITY)
Admission: RE | Admit: 2017-09-21 | Discharge: 2017-09-22 | DRG: 039 | Disposition: A | Discharge status: Home / Self Care | Payer: BLUE CROSS/BLUE SHIELD | Source: Ambulatory Visit | Attending: Vascular Surgery | Admitting: Vascular Surgery

## 2017-09-21 ENCOUNTER — Inpatient Hospital Stay (HOSPITAL_COMMUNITY): Payer: BLUE CROSS/BLUE SHIELD | Admitting: Certified Registered Nurse Anesthetist

## 2017-09-21 DIAGNOSIS — Z79899 Other long term (current) drug therapy: Secondary | ICD-10-CM | POA: Diagnosis not present

## 2017-09-21 DIAGNOSIS — I6521 Occlusion and stenosis of right carotid artery: Principal | ICD-10-CM | POA: Diagnosis present

## 2017-09-21 DIAGNOSIS — F419 Anxiety disorder, unspecified: Secondary | ICD-10-CM | POA: Diagnosis present

## 2017-09-21 DIAGNOSIS — I10 Essential (primary) hypertension: Secondary | ICD-10-CM | POA: Diagnosis present

## 2017-09-21 DIAGNOSIS — Z7982 Long term (current) use of aspirin: Secondary | ICD-10-CM

## 2017-09-21 DIAGNOSIS — Z87891 Personal history of nicotine dependence: Secondary | ICD-10-CM | POA: Diagnosis not present

## 2017-09-21 DIAGNOSIS — I6529 Occlusion and stenosis of unspecified carotid artery: Secondary | ICD-10-CM | POA: Diagnosis present

## 2017-09-21 HISTORY — DX: Peripheral vascular disease, unspecified: I73.9

## 2017-09-21 HISTORY — PX: PATCH ANGIOPLASTY: SHX6230

## 2017-09-21 HISTORY — DX: Personal history of urinary calculi: Z87.442

## 2017-09-21 HISTORY — PX: ENDARTERECTOMY: SHX5162

## 2017-09-21 LAB — COMPREHENSIVE METABOLIC PANEL
ALBUMIN: 3.6 g/dL (ref 3.5–5.0)
ALT: 21 U/L (ref 17–63)
ANION GAP: 7 (ref 5–15)
AST: 19 U/L (ref 15–41)
Alkaline Phosphatase: 77 U/L (ref 38–126)
BUN: 16 mg/dL (ref 6–20)
CO2: 23 mmol/L (ref 22–32)
Calcium: 9 mg/dL (ref 8.9–10.3)
Chloride: 109 mmol/L (ref 101–111)
Creatinine, Ser: 0.8 mg/dL (ref 0.61–1.24)
GFR calc Af Amer: >60 mL/min (ref >60)
GFR calc non Af Amer: >60 mL/min (ref >60)
GLUCOSE: 102 mg/dL — AB (ref 65–99)
POTASSIUM: 3.8 mmol/L (ref 3.5–5.1)
SODIUM: 139 mmol/L (ref 135–145)
TOTAL PROTEIN: 6.2 g/dL — AB (ref 6.5–8.1)
Total Bilirubin: 1.1 mg/dL (ref 0.3–1.2)

## 2017-09-21 LAB — SURGICAL PCR SCREEN
MRSA, PCR: NEGATIVE
STAPHYLOCOCCUS AUREUS: NEGATIVE

## 2017-09-21 LAB — PROTIME-INR
INR: 1
Prothrombin Time: 13.1 seconds (ref 11.4–15.2)

## 2017-09-21 LAB — TYPE AND SCREEN
ABO/RH(D): B POS
Antibody Screen: NEGATIVE

## 2017-09-21 LAB — CBC
HCT: 39.8 % (ref 39.0–52.0)
Hemoglobin: 13.7 g/dL (ref 13.0–17.0)
MCH: 32.1 pg (ref 26.0–34.0)
MCHC: 34.4 g/dL (ref 30.0–36.0)
MCV: 93.2 fL (ref 78.0–100.0)
Platelets: 174 10*3/uL (ref 150–400)
RBC: 4.27 MIL/uL (ref 4.22–5.81)
RDW: 12.4 % (ref 11.5–15.5)
WBC: 4 10*3/uL (ref 4.0–10.5)

## 2017-09-21 LAB — APTT: APTT: 28 s (ref 24–36)

## 2017-09-21 LAB — ABO/RH: ABO/RH(D): B POS

## 2017-09-21 SURGERY — ENDARTERECTOMY, CAROTID
Anesthesia: General | Site: Neck | Laterality: Right

## 2017-09-21 MED ORDER — MORPHINE SULFATE (PF) 2 MG/ML IV SOLN: 2.0000 mg | INTRAVENOUS | Status: DC | PRN

## 2017-09-21 MED ORDER — DOCUSATE SODIUM 100 MG PO CAPS
100.0000 mg | ORAL_CAPSULE | Freq: Every day | ORAL | Status: DC
  Administered 2017-09-22: 100 mg via ORAL
  Filled 2017-09-21: qty 1

## 2017-09-21 MED ORDER — LIDOCAINE HCL (PF) 1 % IJ SOLN
INTRAMUSCULAR | Status: AC
  Filled 2017-09-21: qty 5

## 2017-09-21 MED ORDER — SODIUM CHLORIDE 0.9 % IV SOLN
INTRAVENOUS | Status: DC | PRN
  Administered 2017-09-21: 08:00:00

## 2017-09-21 MED ORDER — LABETALOL HCL 5 MG/ML IV SOLN: 10.0000 mg | INTRAVENOUS | Status: DC | PRN

## 2017-09-21 MED ORDER — SUCCINYLCHOLINE CHLORIDE 200 MG/10ML IV SOSY
PREFILLED_SYRINGE | INTRAVENOUS | Status: AC
  Filled 2017-09-21: qty 10

## 2017-09-21 MED ORDER — VITAMIN E 45 MG (100 UNIT) PO CAPS
200.0000 [IU] | ORAL_CAPSULE | Freq: Every day | ORAL | Status: DC
  Administered 2017-09-21 – 2017-09-22 (×2): 200 [IU] via ORAL
  Filled 2017-09-21 (×2): qty 2

## 2017-09-21 MED ORDER — ONDANSETRON HCL 4 MG/2ML IJ SOLN
4.0000 mg | Freq: Four times a day (QID) | INTRAMUSCULAR | Status: DC | PRN
  Administered 2017-09-21 – 2017-09-22 (×3): 4 mg via INTRAVENOUS
  Filled 2017-09-21 (×3): qty 2

## 2017-09-21 MED ORDER — 0.9 % SODIUM CHLORIDE (POUR BTL) OPTIME
TOPICAL | Status: DC | PRN
  Administered 2017-09-21: 2000 mL

## 2017-09-21 MED ORDER — ACETAMINOPHEN 325 MG PO TABS: 325.0000 mg | ORAL_TABLET | ORAL | Status: DC | PRN

## 2017-09-21 MED ORDER — CHLORHEXIDINE GLUCONATE CLOTH 2 % EX PADS: 6.0000 | MEDICATED_PAD | Freq: Once | CUTANEOUS | Status: DC

## 2017-09-21 MED ORDER — ONDANSETRON HCL 4 MG/2ML IJ SOLN
INTRAMUSCULAR | Status: AC
  Filled 2017-09-21: qty 2

## 2017-09-21 MED ORDER — PROTAMINE SULFATE 10 MG/ML IV SOLN
INTRAVENOUS | Status: DC | PRN
  Administered 2017-09-21: 20 mg via INTRAVENOUS
  Administered 2017-09-21: 10 mg via INTRAVENOUS
  Administered 2017-09-21: 20 mg via INTRAVENOUS

## 2017-09-21 MED ORDER — ASPIRIN EC 325 MG PO TBEC
325.0000 mg | DELAYED_RELEASE_TABLET | Freq: Every day | ORAL | Status: DC
Start: 2017-09-22 — End: 2017-09-22
  Administered 2017-09-22: 325 mg via ORAL
  Filled 2017-09-21: qty 1

## 2017-09-21 MED ORDER — ENOXAPARIN SODIUM 40 MG/0.4ML ~~LOC~~ SOLN
40.0000 mg | SUBCUTANEOUS | Status: DC
  Administered 2017-09-22: 40 mg via SUBCUTANEOUS
  Filled 2017-09-21: qty 0.4

## 2017-09-21 MED ORDER — MONTELUKAST SODIUM 10 MG PO TABS: 10.0000 mg | ORAL_TABLET | Freq: Every day | ORAL | Status: DC

## 2017-09-21 MED ORDER — GLYCOPYRROLATE 0.2 MG/ML IJ SOLN
INTRAMUSCULAR | Status: DC | PRN
  Administered 2017-09-21 (×2): 0.2 mg via INTRAVENOUS

## 2017-09-21 MED ORDER — ACETAMINOPHEN 325 MG RE SUPP
325.0000 mg | RECTAL | Status: DC | PRN
  Filled 2017-09-21: qty 2

## 2017-09-21 MED ORDER — LACTATED RINGERS IV SOLN
INTRAVENOUS | Status: DC | PRN
  Administered 2017-09-21 (×2): via INTRAVENOUS

## 2017-09-21 MED ORDER — HEPARIN SODIUM (PORCINE) 1000 UNIT/ML IJ SOLN
INTRAMUSCULAR | Status: DC | PRN
  Administered 2017-09-21: 3000 [IU] via INTRAVENOUS
  Administered 2017-09-21: 9000 [IU] via INTRAVENOUS

## 2017-09-21 MED ORDER — PHENYLEPHRINE 40 MCG/ML (10ML) SYRINGE FOR IV PUSH (FOR BLOOD PRESSURE SUPPORT)
PREFILLED_SYRINGE | INTRAVENOUS | Status: AC
  Filled 2017-09-21: qty 10

## 2017-09-21 MED ORDER — LOSARTAN POTASSIUM 50 MG PO TABS
100.0000 mg | ORAL_TABLET | Freq: Every day | ORAL | Status: DC
  Filled 2017-09-21 (×2): qty 2

## 2017-09-21 MED ORDER — MUPIROCIN 2 % EX OINT
1.0000 "application " | TOPICAL_OINTMENT | Freq: Once | CUTANEOUS | Status: AC
  Administered 2017-09-21: 1 via TOPICAL
  Filled 2017-09-21: qty 22

## 2017-09-21 MED ORDER — FENTANYL CITRATE (PF) 250 MCG/5ML IJ SOLN
INTRAMUSCULAR | Status: AC
  Filled 2017-09-21: qty 5

## 2017-09-21 MED ORDER — MELATONIN 3 MG PO TABS
1.0000 | ORAL_TABLET | Freq: Every day | ORAL | Status: DC
  Filled 2017-09-21: qty 1

## 2017-09-21 MED ORDER — PROPOFOL 10 MG/ML IV BOLUS
INTRAVENOUS | Status: AC
  Filled 2017-09-21: qty 20

## 2017-09-21 MED ORDER — ALUM & MAG HYDROXIDE-SIMETH 200-200-20 MG/5ML PO SUSP: 15.0000 mL | ORAL | Status: DC | PRN

## 2017-09-21 MED ORDER — FENTANYL CITRATE (PF) 100 MCG/2ML IJ SOLN
25.0000 ug | INTRAMUSCULAR | Status: DC | PRN
  Administered 2017-09-21 (×2): 50 ug via INTRAVENOUS

## 2017-09-21 MED ORDER — ONDANSETRON HCL 4 MG/2ML IJ SOLN: 4.0000 mg | Freq: Once | INTRAMUSCULAR | Status: DC | PRN

## 2017-09-21 MED ORDER — HEMOSTATIC AGENTS (NO CHARGE) OPTIME
TOPICAL | Status: DC | PRN
  Administered 2017-09-21: 1 via TOPICAL

## 2017-09-21 MED ORDER — ROCURONIUM BROMIDE 10 MG/ML (PF) SYRINGE
PREFILLED_SYRINGE | INTRAVENOUS | Status: AC
  Filled 2017-09-21: qty 5

## 2017-09-21 MED ORDER — ASPIRIN 81 MG PO TABS: 81.0000 mg | ORAL_TABLET | Freq: Every day | ORAL | Status: DC

## 2017-09-21 MED ORDER — LIDOCAINE HCL (CARDIAC) 20 MG/ML IV SOLN
INTRAVENOUS | Status: DC | PRN
  Administered 2017-09-21: 60 mg via INTRAVENOUS

## 2017-09-21 MED ORDER — FENTANYL CITRATE (PF) 100 MCG/2ML IJ SOLN
INTRAMUSCULAR | Status: DC | PRN
  Administered 2017-09-21: 100 ug via INTRAVENOUS
  Administered 2017-09-21: 50 ug via INTRAVENOUS

## 2017-09-21 MED ORDER — PHENYLEPHRINE HCL 10 MG/ML IJ SOLN
INTRAMUSCULAR | Status: DC | PRN
  Administered 2017-09-21: 50 ug/min via INTRAVENOUS

## 2017-09-21 MED ORDER — SUGAMMADEX SODIUM 200 MG/2ML IV SOLN
INTRAVENOUS | Status: DC | PRN
  Administered 2017-09-21: 200 mg via INTRAVENOUS

## 2017-09-21 MED ORDER — OXYCODONE-ACETAMINOPHEN 5-325 MG PO TABS
ORAL_TABLET | ORAL | Status: AC
  Filled 2017-09-21: qty 2

## 2017-09-21 MED ORDER — SODIUM CHLORIDE 0.9 % IV SOLN: 500.0000 mL | Freq: Once | INTRAVENOUS | Status: DC | PRN

## 2017-09-21 MED ORDER — LIDOCAINE HCL 4 % EX SOLN
CUTANEOUS | Status: DC | PRN
  Administered 2017-09-21: 4 mL via TOPICAL

## 2017-09-21 MED ORDER — SODIUM CHLORIDE 0.9 % IV SOLN
INTRAVENOUS | Status: DC | PRN
  Administered 2017-09-21: .075 ug/kg/min via INTRAVENOUS

## 2017-09-21 MED ORDER — FENTANYL CITRATE (PF) 100 MCG/2ML IJ SOLN
INTRAMUSCULAR | Status: AC
  Filled 2017-09-21: qty 2

## 2017-09-21 MED ORDER — VANCOMYCIN HCL IN DEXTROSE 1-5 GM/200ML-% IV SOLN
1000.0000 mg | INTRAVENOUS | Status: AC
  Administered 2017-09-21: 1000 mg via INTRAVENOUS
  Filled 2017-09-21: qty 200

## 2017-09-21 MED ORDER — SODIUM CHLORIDE 0.9 % IV SOLN: INTRAVENOUS | Status: DC

## 2017-09-21 MED ORDER — ALPRAZOLAM 0.25 MG PO TABS: 0.2500 mg | ORAL_TABLET | Freq: Two times a day (BID) | ORAL | Status: DC | PRN

## 2017-09-21 MED ORDER — METOPROLOL TARTRATE 5 MG/5ML IV SOLN: 2.0000 mg | INTRAVENOUS | Status: DC | PRN

## 2017-09-21 MED ORDER — SODIUM CHLORIDE 0.9 % IV SOLN
0.0125 ug/kg/min | INTRAVENOUS | Status: DC
  Filled 2017-09-21: qty 2000

## 2017-09-21 MED ORDER — DEXAMETHASONE SODIUM PHOSPHATE 10 MG/ML IJ SOLN
INTRAMUSCULAR | Status: AC
  Filled 2017-09-21: qty 1

## 2017-09-21 MED ORDER — PROPOFOL 10 MG/ML IV BOLUS
INTRAVENOUS | Status: DC | PRN
  Administered 2017-09-21: 150 mg via INTRAVENOUS

## 2017-09-21 MED ORDER — DEXAMETHASONE SODIUM PHOSPHATE 10 MG/ML IJ SOLN
INTRAMUSCULAR | Status: DC | PRN
  Administered 2017-09-21: 10 mg via INTRAVENOUS

## 2017-09-21 MED ORDER — PHENOL 1.4 % MT LIQD
1.0000 | OROMUCOSAL | Status: DC | PRN
  Administered 2017-09-21: 1 via OROMUCOSAL
  Filled 2017-09-21: qty 177

## 2017-09-21 MED ORDER — ROCURONIUM BROMIDE 100 MG/10ML IV SOLN
INTRAVENOUS | Status: DC | PRN
  Administered 2017-09-21: 50 mg via INTRAVENOUS

## 2017-09-21 MED ORDER — GUAIFENESIN-DM 100-10 MG/5ML PO SYRP: 15.0000 mL | ORAL_SOLUTION | ORAL | Status: DC | PRN

## 2017-09-21 MED ORDER — ONDANSETRON HCL 4 MG/2ML IJ SOLN
INTRAMUSCULAR | Status: DC | PRN
  Administered 2017-09-21: 4 mg via INTRAVENOUS

## 2017-09-21 MED ORDER — MAGNESIUM SULFATE 2 GM/50ML IV SOLN
2.0000 g | Freq: Every day | INTRAVENOUS | Status: DC | PRN
  Filled 2017-09-21: qty 50

## 2017-09-21 MED ORDER — GABAPENTIN 300 MG PO CAPS: 300.0000 mg | ORAL_CAPSULE | Freq: Every day | ORAL | Status: DC

## 2017-09-21 MED ORDER — HYDRALAZINE HCL 20 MG/ML IJ SOLN: 5.0000 mg | INTRAMUSCULAR | Status: DC | PRN

## 2017-09-21 MED ORDER — OXYCODONE-ACETAMINOPHEN 5-325 MG PO TABS
1.0000 | ORAL_TABLET | ORAL | Status: DC | PRN
  Administered 2017-09-21 (×2): 2 via ORAL
  Filled 2017-09-21: qty 2

## 2017-09-21 MED ORDER — BUPROPION HCL ER (XL) 150 MG PO TB24
150.0000 mg | ORAL_TABLET | Freq: Every day | ORAL | Status: DC
  Administered 2017-09-22: 150 mg via ORAL
  Filled 2017-09-21: qty 1

## 2017-09-21 MED ORDER — PANTOPRAZOLE SODIUM 40 MG PO TBEC
40.0000 mg | DELAYED_RELEASE_TABLET | Freq: Every day | ORAL | Status: DC
  Administered 2017-09-21 – 2017-09-22 (×2): 40 mg via ORAL
  Filled 2017-09-21 (×2): qty 1

## 2017-09-21 MED ORDER — EPHEDRINE SULFATE 50 MG/ML IJ SOLN
INTRAMUSCULAR | Status: DC | PRN
  Administered 2017-09-21 (×2): 10 mg via INTRAVENOUS

## 2017-09-21 MED ORDER — POTASSIUM CHLORIDE CRYS ER 20 MEQ PO TBCR: 20.0000 meq | EXTENDED_RELEASE_TABLET | Freq: Every day | ORAL | Status: DC | PRN

## 2017-09-21 MED ORDER — PRAVASTATIN SODIUM 40 MG PO TABS
40.0000 mg | ORAL_TABLET | Freq: Every evening | ORAL | Status: DC
  Administered 2017-09-21: 40 mg via ORAL
  Filled 2017-09-21: qty 1

## 2017-09-21 MED ORDER — AZELASTINE HCL 0.1 % NA SOLN
1.0000 | Freq: Every day | NASAL | Status: DC | PRN
  Filled 2017-09-21: qty 30

## 2017-09-21 SURGICAL SUPPLY — 46 items
CANISTER SUCT 3000ML PPV (MISCELLANEOUS) ×3 IMPLANT
CATH ROBINSON RED A/P 18FR (CATHETERS) ×3 IMPLANT
CLIP VESOCCLUDE MED 24/CT (CLIP) ×3 IMPLANT
CLIP VESOCCLUDE SM WIDE 24/CT (CLIP) ×3 IMPLANT
CRADLE DONUT ADULT HEAD (MISCELLANEOUS) ×3 IMPLANT
DERMABOND ADVANCED (GAUZE/BANDAGES/DRESSINGS) ×2
DERMABOND ADVANCED .7 DNX12 (GAUZE/BANDAGES/DRESSINGS) ×1 IMPLANT
DRAIN CHANNEL 15F RND FF W/TCR (WOUND CARE) IMPLANT
ELECT REM PT RETURN 9FT ADLT (ELECTROSURGICAL) ×3
ELECTRODE REM PT RTRN 9FT ADLT (ELECTROSURGICAL) ×1 IMPLANT
EVACUATOR SILICONE 100CC (DRAIN) IMPLANT
GLOVE BIO SURGEON STRL SZ 6.5 (GLOVE) ×2 IMPLANT
GLOVE BIO SURGEON STRL SZ7.5 (GLOVE) ×3 IMPLANT
GLOVE BIO SURGEONS STRL SZ 6.5 (GLOVE) ×1
GLOVE BIOGEL PI IND STRL 6.5 (GLOVE) ×2 IMPLANT
GLOVE BIOGEL PI IND STRL 8.5 (GLOVE) ×2 IMPLANT
GLOVE BIOGEL PI INDICATOR 6.5 (GLOVE) ×4
GLOVE BIOGEL PI INDICATOR 8.5 (GLOVE) ×4
GLOVE SS BIOGEL STRL SZ 8 (GLOVE) ×2 IMPLANT
GLOVE SUPERSENSE BIOGEL SZ 8 (GLOVE) ×4
GLOVE SURG SS PI 6.5 STRL IVOR (GLOVE) ×3 IMPLANT
GOWN STRL NON-REIN LRG LVL3 (GOWN DISPOSABLE) ×3 IMPLANT
GOWN STRL REUS W/ TWL LRG LVL3 (GOWN DISPOSABLE) ×2 IMPLANT
GOWN STRL REUS W/ TWL XL LVL3 (GOWN DISPOSABLE) ×3 IMPLANT
GOWN STRL REUS W/TWL LRG LVL3 (GOWN DISPOSABLE) ×4
GOWN STRL REUS W/TWL XL LVL3 (GOWN DISPOSABLE) ×6
HEMOSTAT SNOW SURGICEL 2X4 (HEMOSTASIS) ×3 IMPLANT
INSERT FOGARTY SM (MISCELLANEOUS) ×3 IMPLANT
KIT BASIN OR (CUSTOM PROCEDURE TRAY) ×3 IMPLANT
KIT ROOM TURNOVER OR (KITS) ×3 IMPLANT
NEEDLE HYPO 25GX1X1/2 BEV (NEEDLE) IMPLANT
PACK CAROTID (CUSTOM PROCEDURE TRAY) ×3 IMPLANT
PAD ARMBOARD 7.5X6 YLW CONV (MISCELLANEOUS) ×6 IMPLANT
PATCH VASC XENOSURE 1CMX6CM (Vascular Products) ×2 IMPLANT
PATCH VASC XENOSURE 1X6 (Vascular Products) ×1 IMPLANT
SHUNT CAROTID BYPASS 10 (VASCULAR PRODUCTS) ×3 IMPLANT
SUT ETHILON 3 0 PS 1 (SUTURE) IMPLANT
SUT MNCRL AB 4-0 PS2 18 (SUTURE) ×3 IMPLANT
SUT PROLENE 6 0 BV (SUTURE) ×12 IMPLANT
SUT PROLENE 7 0 BV 1 (SUTURE) ×3 IMPLANT
SUT VIC AB 2-0 CT1 27 (SUTURE) ×2
SUT VIC AB 2-0 CT1 TAPERPNT 27 (SUTURE) ×1 IMPLANT
SUT VIC AB 3-0 SH 27 (SUTURE) ×2
SUT VIC AB 3-0 SH 27X BRD (SUTURE) ×1 IMPLANT
SYR CONTROL 10ML LL (SYRINGE) IMPLANT
WATER STERILE IRR 1000ML POUR (IV SOLUTION) ×3 IMPLANT

## 2017-09-21 NOTE — Anesthesia Procedure Notes (Signed)
Procedure Name: Intubation Date/Time: 09/21/2017 7:30 AM Performed by: Lavell Luster Pre-anesthesia Checklist: Patient identified, Emergency Drugs available, Suction available, Patient being monitored and Timeout performed Patient Re-evaluated:Patient Re-evaluated prior to induction Oxygen Delivery Method: Circle system utilized Preoxygenation: Pre-oxygenation with 100% oxygen Induction Type: IV induction Ventilation: Mask ventilation without difficulty Laryngoscope Size: Mac and 4 Grade View: Grade I Tube type: Oral Tube size: 7.5 mm Number of attempts: 1 Airway Equipment and Method: Stylet Placement Confirmation: ETT inserted through vocal cords under direct vision,  positive ETCO2 and breath sounds checked- equal and bilateral Secured at: 22 cm Tube secured with: Tape Dental Injury: Teeth and Oropharynx as per pre-operative assessment

## 2017-09-21 NOTE — H&P (Signed)
   History and Physical Update  The patient was interviewed and re-examined.  The patient's previous History and Physical has been reviewed and is unchanged from recent office visit but he has a negative stress test. Again discussed the risks and benefits and will proceed with right carotid endarterectomy.  Aylana Hirschfeld C. Randie Heinzain, MD [facility] [place] Office: [phone] Pager: [phone]   09/21/2017, 7:15 AM

## 2017-09-21 NOTE — Anesthesia Procedure Notes (Addendum)
Arterial Line Insertion Performed by: Dorie RankQUINN, Norely Schlick M, CRNA  Preanesthetic checklist: patient identified and IV checked Lidocaine 1% used for infiltration Left, radial was placed Catheter size: 20 G Hand hygiene performed , maximum sterile barriers used  and Seldinger technique used Allen's test indicative of satisfactory collateral circulation Attempts: 1 Following insertion, dressing applied and Biopatch. Post procedure assessment: normal

## 2017-09-21 NOTE — Anesthesia Postprocedure Evaluation (Signed)
Anesthesia Post Note  Patient: Duncan DullStephen L Warrior  Procedure(s) Performed: ENDARTERECTOMY RIGHT CAROTID ARTERY (Right Neck) RIGHT CAROTID ARTERY PATCH ANGIOPLASTY USING XENOSURE BIOLOIC PATCH (Right Neck)     Patient location during evaluation: PACU Anesthesia Type: General Level of consciousness: awake and alert Pain management: pain level controlled Vital Signs Assessment: post-procedure vital signs reviewed and stable Respiratory status: spontaneous breathing, nonlabored ventilation, respiratory function stable and patient connected to nasal cannula oxygen Cardiovascular status: blood pressure returned to baseline and stable Postop Assessment: no apparent nausea or vomiting Anesthetic complications: no    Last Vitals:  Vitals:   09/21/17 0930 09/21/17 1009  BP: 127/79 106/74  Pulse: 99   Resp: (!) 8   Temp: 36.7 C   SpO2:      Last Pain:  Vitals:   09/21/17 1009  TempSrc:   PainSc: 4                  Beryle Lathehomas E Mardie Kellen

## 2017-09-21 NOTE — Op Note (Signed)
Patient name: Jose DullStephen L Craney MRN: [id number] DOB: 01/23/1951 Sex: male  09/21/2017 Pre-operative Diagnosis: asymptomatic high grade right carotid stenosis Post-operative diagnosis:  Same Surgeon:  Luanna SalkBrandon C. Randie Heinzain, MD Assistant: Debbora PrestoSteve Eureste Procedure Performed: Right carotid endarterectomy with bovine pericardial patch angioplasty  Indications: 66 year old male with history of vertigo found to have high-grade asymptomatic right carotid stenosis.Jose Wolf.  He has undergone cardiac workup and has been cleared for surgery.  The risk benefits and alternatives have been discussed with him and he agrees to proceed.  Findings: Carotid bifurcation was actually quite low.  There was a very focal nearly occlusive plaque right at the bifurcation and more cephalad there was plaque on the medial aspect of the vessel that was tacked with 7-0 Prolene.  At completion there was flow throughout diastole in the internal carotid artery as expected.   Procedure:  The patient was identified in the holding area and taken to the OR where he was placed supine on the operating table general anesthesia was induced he was sterilely prepped and draped in the right neck and chest in the usual fashion given antibiotics and timeout called.  Ultrasound had been used to mark the bifurcation which was noted to be quite low.  An incision on the anterior border is to include a moist toe was then made we dissected down to the level of the common carotid and encircled this with umbilical tape and the patient was given 9000 of heparin.  ACT returned to 24 he was given an additional 3000.  We then dissected out the external carotid artery placed a vessel loop around this and then traced the internal placed a vessel loop around this as well.  The hypoglossal nerve was identified however was under the facial vein which was much higher than the bifurcation and so the facial vein was not divided.  After ACT had returned we tightened our vessel  loop on the internal clamps are common carotid artery and tightened vessel loop on the external.  We did open the carotid artery longitudinally with 11 blade extended with Pott scissor.  A 10 French shunt which had been prepared was then placed first into the internal carotid artery and allowed backbleeding and then placed into the common carotid artery and flow was confirmed with Doppler.  Status of this we performed endarterectomy including eversion of the external carotid artery.  Approximately we had good transition zone but distally unfortunately there was one aspect of the plaque medially that was tacked with 7-0 Prolene suture x2.  We then trimmed a bovine pericardial patch and sewed in place with 6-0 Prolene suture.  Prior to completing anastomosis on the lateral aspect we then removed our shunt allowed antegrade and retrograde bleeding from all of our vessels flushed with heparinized saline.  After completing we then opened our external carotid artery completed our suture line and then opened our common carotid artery.  After several cardiac cycles we then opened our internal.  There was significant suture line bleeding and these were repaired with interrupted 6-0 Prolene sutures.  Doppler then confirmed continuous flow throughout diastole and internal carotid artery and 50 mg of protamine was administered and he tolerated this well.  We then obtained hemostasis and the wound irrigated and closed the platysma with 2-0 Vicryl followed by 4-0 Monocryl at the level of the skin. Upon awakening from anesthesia he was moving all 4 extremities to command.    EBL: 200cc  [name], MD [facility] [place] Office: [phone] Pager: [phone]

## 2017-09-21 NOTE — Transfer of Care (Signed)
Immediate Anesthesia Transfer of Care Note  Patient: Jose Wolf  Procedure(s) Performed: ENDARTERECTOMY RIGHT CAROTID ARTERY (Right Neck) RIGHT CAROTID ARTERY PATCH ANGIOPLASTY USING XENOSURE BIOLOIC PATCH (Right Neck)  Patient Location: PACU  Anesthesia Type:General  Level of Consciousness: awake, alert  and sedated  Airway & Oxygen Therapy: Patient connected to face mask oxygen  Post-op Assessment: Post -op Vital signs reviewed and stable  Post vital signs: stable  Last Vitals:  Vitals:   09/21/17 0648  BP: 139/85  Pulse: 71  Resp: 18  Temp: 36.9 C  SpO2: 100%    Last Pain:  Vitals:   09/21/17 0648  TempSrc: Oral      Patients Stated Pain Goal: 3 (09/21/17 0641)  Complications: No apparent anesthesia complications

## 2017-09-22 ENCOUNTER — Encounter (HOSPITAL_COMMUNITY): Payer: Self-pay | Admitting: Vascular Surgery

## 2017-09-22 ENCOUNTER — Telehealth: Payer: Self-pay | Admitting: Vascular Surgery

## 2017-09-22 LAB — BASIC METABOLIC PANEL
ANION GAP: 7 (ref 5–15)
BUN: 11 mg/dL (ref 6–20)
CALCIUM: 9 mg/dL (ref 8.9–10.3)
CHLORIDE: 106 mmol/L (ref 101–111)
CO2: 25 mmol/L (ref 22–32)
Creatinine, Ser: 0.84 mg/dL (ref 0.61–1.24)
GFR calc non Af Amer: >60 mL/min (ref >60)
Glucose, Bld: 130 mg/dL — ABNORMAL HIGH (ref 65–99)
Potassium: 4.2 mmol/L (ref 3.5–5.1)
SODIUM: 138 mmol/L (ref 135–145)

## 2017-09-22 LAB — CBC
HCT: 35.3 % — ABNORMAL LOW (ref 39.0–52.0)
HEMOGLOBIN: 12.1 g/dL — AB (ref 13.0–17.0)
MCH: 31.8 pg (ref 26.0–34.0)
MCHC: 34.3 g/dL (ref 30.0–36.0)
MCV: 92.9 fL (ref 78.0–100.0)
Platelets: 163 10*3/uL (ref 150–400)
RBC: 3.8 MIL/uL — ABNORMAL LOW (ref 4.22–5.81)
RDW: 12.5 % (ref 11.5–15.5)
WBC: 9.1 10*3/uL (ref 4.0–10.5)

## 2017-09-22 LAB — POCT ACTIVATED CLOTTING TIME: ACTIVATED CLOTTING TIME: 224 s

## 2017-09-22 MED ORDER — OXYCODONE-ACETAMINOPHEN 5-325 MG PO TABS: 1.0000 | ORAL_TABLET | Freq: Four times a day (QID) | ORAL | 0 refills | Status: DC | PRN

## 2017-09-22 MED ORDER — ONDANSETRON HCL 4 MG PO TABS: 4.0000 mg | ORAL_TABLET | Freq: Every day | ORAL | 0 refills | Status: DC | PRN

## 2017-09-22 MED ORDER — ONDANSETRON HCL 4 MG PO TABS: 4.0000 mg | ORAL_TABLET | Freq: Four times a day (QID) | ORAL | 0 refills | Status: DC | PRN

## 2017-09-22 NOTE — Progress Notes (Addendum)
  Progress Note    09/22/2017 7:24 AM 1 Day Post-Op  Subjective:  Ready to go home  Afebrile HR 50's-70's  90's-110's systolic 97% RA  Vitals:   09/21/17 2336 09/22/17 0501  BP: 105/66 117/64  Pulse: 80 92  Resp: (!) 24 12  Temp: 98.4 F (36.9 C) 98.2 F (36.8 C)  SpO2: 94% 94%     Physical Exam: Neuro:  In tact; tongue is midline; moving all extremities equally Lungs:  Non labored Incision:  Clean and dry without hematoma  CBC    Component Value Date/Time   WBC 9.1 09/22/2017 0416   RBC 3.80 (L) 09/22/2017 0416   HGB 12.1 (L) 09/22/2017 0416   HGB 15.4 12/25/2016 0827   HCT 35.3 (L) 09/22/2017 0416   HCT 46.0 12/25/2016 0827   PLT 163 09/22/2017 0416   PLT 192 12/25/2016 0827   MCV 92.9 09/22/2017 0416   MCV 96 12/25/2016 0827   MCH 31.8 09/22/2017 0416   MCHC 34.3 09/22/2017 0416   RDW 12.5 09/22/2017 0416   RDW 12.6 12/25/2016 0827    BMET    Component Value Date/Time   NA 138 09/22/2017 0416   NA 142 12/25/2016 0827   K 4.2 09/22/2017 0416   CL 106 09/22/2017 0416   CO2 25 09/22/2017 0416   GLUCOSE 130 (H) 09/22/2017 0416   BUN 11 09/22/2017 0416   BUN 19 12/25/2016 0827   CREATININE 0.84 09/22/2017 0416   CALCIUM 9.0 09/22/2017 0416   GFRNONAA >60 09/22/2017 0416   GFRAA >60 09/22/2017 0416     Intake/Output Summary (Last 24 hours) at 09/22/17 0724 Last data filed at 09/22/17 0600  Gross per 24 hour  Intake             1000 ml  Output             1000 ml  Net                0 ml     Assessment/Plan:  This is a 66 y.o. male who is s/p right CEA 1 Day Post-Op  -pt is doing well this am. -pt neuro exam is in tact -pt has not ambulated in the hallways-will need to walk in the halls -pt has voided -f/u with Dr. Randie Heinzain in 2 weeks. -discharge home after breakfast   Doreatha MassedSamantha Rhyne, PA-C [facility] [phone]   I have independently interviewed and examined patient and agree with PA assessment and plan above.    Yevonne Yokum C. Randie Heinzain, MD [facility] [place] Office: [phone] Pager: [phone]
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
## 2017-09-22 NOTE — Care Management Note (Signed)
Case Management Note Donn PieriniKristi Zarius Furr RN, BSN Unit 4E-Case Manager [phone]  Patient Details  Name: Jose DullStephen L Sayre MRN: [id number] Date of Birth: 1951-05-11  Subjective/Objective:     Pt admitted s/p CEA               Action/Plan: PTA pt lived at home with spouse- pt for d/c home today- no CM needs noted for discharge  Expected Discharge Date:  09/22/17               Expected Discharge Plan:  Home/Self Care  In-House Referral:  NA  Discharge planning Services  CM Consult  Post Acute Care Choice:  NA Choice offered to:  NA  DME Arranged:    DME Agency:     HH Arranged:    HH Agency:     Status of Service:  Completed, signed off  If discussed at Long Length of Stay Meetings, dates discussed:    Discharge Disposition: home/self care   Additional Comments:  Darrold SpanWebster, Rosey Eide Hall, RN 09/22/2017, 10:29 AM

## 2017-09-22 NOTE — Discharge Instructions (Signed)
° °  [facility] [place] ° °Discharge Instructions °  °Carotid Endarterectomy (CEA) ° °Please refer to the following instructions for your post-procedure care. Your surgeon or physician assistant will discuss any changes with you. ° °Activity ° °You are encouraged to walk as much as you can. You can slowly return to normal activities but must avoid strenuous activity and heavy lifting until your doctor tell you it's OK. Avoid activities such as vacuuming or swinging a golf club. You can drive after one week if you are comfortable and you are no longer taking prescription pain medications. It is normal to feel tired for serval weeks after your surgery. It is also normal to have difficulty with sleep habits, eating, and bowel movements after surgery. These will go away with time. ° °Bathing/Showering ° °You may shower after you go home. Do not soak in a bathtub, hot tub, or swim until the incision heals completely. ° °Incision Care ° °Shower every day. Clean your incision with mild soap and water. Pat the area dry with a clean towel. You do not need a bandage unless otherwise instructed. Do not apply any ointments or creams to your incision. You may have skin glue on your incision. Do not peel it off. It will come off on its own in about one week. Your incision may feel thickened and raised for several weeks after your surgery. This is normal and the skin will soften over time. For Men Only: It's OK to shave around the incision but do not shave the incision itself for 2 weeks. It is common to have numbness under your chin that could last for several months. ° °Diet ° °Resume your normal diet. There are no special food restrictions following this procedure. A low fat/low cholesterol diet is recommended for all patients with vascular disease. In order to heal from your surgery, it is CRITICAL to get adequate nutrition. Your body requires vitamins, minerals, and protein. Vegetables are the best  source of vitamins and minerals. Vegetables also provide the perfect balance of protein. Processed food has little nutritional value, so try to avoid this. ° °Medications ° °Resume taking all of your medications unless your doctor or physician assistant tells you not to. If your incision is causing pain, you may take over-the- counter pain relievers such as acetaminophen (Tylenol). If you were prescribed a stronger pain medication, please be aware these medications can cause nausea and constipation. Prevent nausea by taking the medication with a snack or meal. Avoid constipation by drinking plenty of fluids and eating foods with a high amount of fiber, such as fruits, vegetables, and grains. Do not take Tylenol if you are taking prescription pain medications. ° °Follow Up ° °Our office will schedule a follow up appointment 2-3 weeks following discharge. ° °Please call us immediately for any of the following conditions ° °Increased pain, redness, drainage (pus) from your incision site. °Fever of 101 degrees or higher. °If you should develop stroke (slurred speech, difficulty swallowing, weakness on one side of your body, loss of vision) you should call 911 and go to the nearest emergency room. ° °Reduce your risk of vascular disease: ° °Stop smoking. If you would like help call [facility] at 1-800-QUIT-NOW (1-800-784-8669) or [place] at 336-586-4000. °Manage your cholesterol °Maintain a desired weight °Control your diabetes °Keep your blood pressure down ° °If you have any questions, please call the office at [phone]. ° °

## 2017-09-22 NOTE — Telephone Encounter (Signed)
-----   Message from Sharee PimpleMarilyn K McChesney, RN sent at 09/22/2017  8:36 AM EDT ----- Regarding: 2 weeks postop CEA   ----- Message ----- From: Dara Lordshyne, Samantha J, PA-C Sent: 09/22/2017   7:29 AM To: [facility] Charge Pool  S/p right carotid endarterectomy.  F/u with Dr. Randie Heinzain in 2 weeks.  Thanks

## 2017-09-22 NOTE — Telephone Encounter (Signed)
Sched apppt 10/09/17 at 9:15. Spoke to pt.

## 2017-09-22 NOTE — Progress Notes (Signed)
Order received to discharge patient.  PIV access removed without difficulty.  Arterial line removed without difficulty.  Telemetry monitor removed and CCMD notified.  Discharge instructions, follow up, medications and instructions for their use discussed with patient.

## 2017-09-24 DIAGNOSIS — R0602 Shortness of breath: Secondary | ICD-10-CM | POA: Diagnosis not present

## 2017-09-24 DIAGNOSIS — R51 Headache: Secondary | ICD-10-CM | POA: Diagnosis not present

## 2017-09-24 LAB — PROTIME-INR: INR: 1 (ref 0.9–1.1)

## 2017-09-25 ENCOUNTER — Telehealth: Payer: Self-pay | Admitting: Cardiology

## 2017-09-25 ENCOUNTER — Encounter: Payer: Self-pay | Admitting: Vascular Surgery

## 2017-09-25 ENCOUNTER — Ambulatory Visit (INDEPENDENT_AMBULATORY_CARE_PROVIDER_SITE_OTHER): Payer: Self-pay | Admitting: Vascular Surgery

## 2017-09-25 VITALS — BP 150/80 | HR 72 | Temp 97.5°F | Resp 16 | Ht 70.0 in | Wt 189.0 lb

## 2017-09-25 DIAGNOSIS — I6521 Occlusion and stenosis of right carotid artery: Secondary | ICD-10-CM

## 2017-09-25 MED ORDER — AMLODIPINE BESYLATE 5 MG PO TABS
5.0000 mg | ORAL_TABLET | Freq: Every day | ORAL | 6 refills | Status: AC
Start: 2017-09-25 — End: 2020-02-23

## 2017-09-25 NOTE — Progress Notes (Signed)
Subjective:     Patient ID: Jose DullStephen L Straub, male   DOB: Apr 21, 1951, 66 y.o.   MRN: [id number]  HPI 66 year old male status post carotid endarterectomy earlier this week shows up with headaches that have occurred at nighttime.  [facility] yesterday had blood pressure 200 systolic on discharge he was down to 150.  Says he was 180 going home.  He takes one blood pressure medicine at the direction of Dr.Kahn.  He is otherwise without neurologic deficits.  He has had some pain behind his right eye when his blood pressures been elevated.   Review of Systems Right eye headaches    Objective:   Physical Exam aaox3 Cn in tact Neuro in tact Right neck incision cdi    Assessment/plan     66 year old male follows up with headaches status post carotid endarterectomy.  I advised him to see his primary care doctor to get his blood pressure and check.  If he cannot did not check he will come to come in hospital will need to admit him for blood pressure control and neurologic monitoring.  I will otherwise plan to see him at his regular scheduled follow-up with duplex.     Jearlean Demauro C. Randie Heinzain, MD [facility] [place] Office: [phone] Pager: [phone]

## 2017-09-25 NOTE — Telephone Encounter (Signed)
Agree 

## 2017-09-25 NOTE — Telephone Encounter (Signed)
New Message     Pt c/o BP issue: STAT if pt c/o blurred vision, one-sided weakness or slurred speech  1. What are your last 5 BP readings? 163/97   2. Are you having any other symptoms (ex. Dizziness, headache, blurred vision, passed out)?  Migraine level headache, and dizziness   3. What is your BP issue? Dr Randie Heinzain wants his bp increased due to patient bp being elevated , if Dr Antoine PocheHochrein can not get bp medication adjusted he advised [facility]

## 2017-09-25 NOTE — Telephone Encounter (Signed)
Received a call from patient's wife.She stated husband's B/P has been elevated the past 2 days.Yesterday 220/110.She [facility] and nothing was done for him.Today B/P 163/97.She does not remember pulse.He is complaining of a headache and dizziness.Stated she called Dr.Cain and he advised to call Dr.Hochrein.Advised Dr.Hochrein not in office.I will speak to DOD Dr.Harding. Spoke to DOD Dr.Harding he advised to continue Losartan 100 mg daily and start Amlodipine 5 mg daily.Appointment scheduled with Azalee CourseHao Meng PA 10/02/17 at 8:00 am.
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
## 2017-10-02 ENCOUNTER — Ambulatory Visit (INDEPENDENT_AMBULATORY_CARE_PROVIDER_SITE_OTHER): Payer: Medicare Other | Admitting: Physician Assistant

## 2017-10-02 ENCOUNTER — Encounter: Payer: Self-pay | Admitting: Physician Assistant

## 2017-10-02 VITALS — BP 132/74 | HR 81 | Ht 70.0 in | Wt 185.6 lb

## 2017-10-02 DIAGNOSIS — I6521 Occlusion and stenosis of right carotid artery: Secondary | ICD-10-CM

## 2017-10-02 DIAGNOSIS — Z9889 Other specified postprocedural states: Secondary | ICD-10-CM

## 2017-10-02 DIAGNOSIS — E782 Mixed hyperlipidemia: Secondary | ICD-10-CM

## 2017-10-02 NOTE — Patient Instructions (Signed)
Medication Instructions:  Your physician recommends that you continue on your current medications as directed. Please refer to the Current Medication list given to you today.  Labwork: Your physician recommends that you return for lab work in: 1 month FASTING LIPIDS & LFT  Testing/Procedures: None   Follow-Up: Your physician recommends that you schedule a follow-up appointment in: 2-3 Months with Dr Antoine PocheHochrein.  Any Other Special Instructions Will Be Listed Below (If Applicable).  If you need a refill on your cardiac medications before your next appointment, please call your pharmacy.

## 2017-10-05 ENCOUNTER — Telehealth: Payer: Self-pay | Admitting: Physician Assistant

## 2017-10-05 NOTE — Telephone Encounter (Signed)
Received incoming records from [facility]. [facility] with note to [place] [facility] for upcoming appointment on 01/04/18 with Dr. Antoine PocheHochrein. 10/05/17 ab

## 2017-10-06 DIAGNOSIS — Z1339 Encounter for screening examination for other mental health and behavioral disorders: Secondary | ICD-10-CM | POA: Diagnosis not present

## 2017-10-06 DIAGNOSIS — Z1331 Encounter for screening for depression: Secondary | ICD-10-CM | POA: Diagnosis not present

## 2017-10-06 DIAGNOSIS — H81319 Aural vertigo, unspecified ear: Secondary | ICD-10-CM | POA: Diagnosis not present

## 2017-10-06 DIAGNOSIS — H6983 Other specified disorders of Eustachian tube, bilateral: Secondary | ICD-10-CM | POA: Diagnosis not present

## 2017-10-09 ENCOUNTER — Encounter: Payer: Self-pay | Admitting: Vascular Surgery

## 2017-10-09 ENCOUNTER — Ambulatory Visit (INDEPENDENT_AMBULATORY_CARE_PROVIDER_SITE_OTHER): Payer: Medicare Other | Admitting: Vascular Surgery

## 2017-10-09 VITALS — BP 108/69 | HR 78 | Temp 97.3°F | Resp 20 | Ht 70.0 in | Wt 190.0 lb

## 2017-10-09 DIAGNOSIS — I6521 Occlusion and stenosis of right carotid artery: Secondary | ICD-10-CM

## 2017-10-09 NOTE — Progress Notes (Signed)
Subjective:     Patient ID: Duncan DullStephen L Mcbee, male   DOB: 1951/01/29, 66 y.o.   MRN: [id number]  HPI 66 year old male recently underwent right carotid endarterectomy for asymptomatic high-grade stenosis.  Post procedure he had headache with hypertension that has now been controlled and his headache is resolved.  He has had no neurologic issues and is healing well.  Only complaint is numbness of his right neck.   Review of Systems No complaints at this time     Objective:   Physical Exam aaox3 Non labored respirations  Right neck incision healing well Neurologically in tact     Assessment/plan     66 year old male follows up from right carotid endarterectomy postop was having hypertension and headache that is now resolved.  He is free to return to normal activity will follow-up in 6 months with carotid duplex should he not need to be seen sooner.  Kimie Pidcock C. Randie Heinzain, MD [facility] [place] Office: [phone] Pager: [phone]

## 2017-10-12 NOTE — Addendum Note (Signed)
Addended by: Burton ApleyPETTY, Anabelle Bungert A on: 10/12/2017 04:33 PM   Modules accepted: Orders

## 2017-10-19 DIAGNOSIS — Z23 Encounter for immunization: Secondary | ICD-10-CM | POA: Diagnosis not present

## 2017-10-20 DIAGNOSIS — R0683 Snoring: Secondary | ICD-10-CM | POA: Diagnosis not present

## 2017-10-20 DIAGNOSIS — I10 Essential (primary) hypertension: Secondary | ICD-10-CM | POA: Diagnosis not present

## 2017-10-20 DIAGNOSIS — E785 Hyperlipidemia, unspecified: Secondary | ICD-10-CM | POA: Diagnosis not present

## 2017-10-20 DIAGNOSIS — Z6826 Body mass index (BMI) 26.0-26.9, adult: Secondary | ICD-10-CM | POA: Diagnosis not present

## 2017-10-23 ENCOUNTER — Other Ambulatory Visit: Payer: Self-pay | Admitting: *Deleted

## 2017-10-23 MED ORDER — MONTELUKAST SODIUM 10 MG PO TABS: ORAL_TABLET | ORAL | 1 refills | Status: DC

## 2017-11-23 ENCOUNTER — Other Ambulatory Visit: Payer: Self-pay

## 2017-11-23 MED ORDER — AZELASTINE HCL 137 MCG/SPRAY NA SOLN: 1.0000 | Freq: Every day | NASAL | 0 refills | Status: DC
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
## 2018-01-04 ENCOUNTER — Encounter: Payer: Self-pay | Admitting: Cardiology

## 2018-01-04 ENCOUNTER — Ambulatory Visit (INDEPENDENT_AMBULATORY_CARE_PROVIDER_SITE_OTHER): Payer: Medicare Other | Admitting: Cardiology

## 2018-01-04 VITALS — BP 110/74 | HR 74 | Ht 70.0 in | Wt 191.0 lb

## 2018-01-04 DIAGNOSIS — E782 Mixed hyperlipidemia: Secondary | ICD-10-CM

## 2018-01-04 DIAGNOSIS — I6521 Occlusion and stenosis of right carotid artery: Secondary | ICD-10-CM

## 2018-01-04 DIAGNOSIS — I10 Essential (primary) hypertension: Secondary | ICD-10-CM

## 2018-01-04 NOTE — Patient Instructions (Addendum)
Medication Instructions:  Continue current medications  If you need a refill on your cardiac medications before your next appointment, please call your pharmacy.  Labwork: None Ordered  Testing/Procedures: None Ordered  Follow-Up: Your physician wants you to follow-up in: As Needed.      Thank you for choosing [facility]!!       

## 2018-01-21 ENCOUNTER — Other Ambulatory Visit: Payer: Self-pay

## 2018-01-21 ENCOUNTER — Other Ambulatory Visit: Payer: Self-pay | Admitting: *Deleted

## 2018-01-21 MED ORDER — AZELASTINE HCL 137 MCG/SPRAY NA SOLN: 1.0000 | Freq: Every day | NASAL | 0 refills | Status: AC

## 2018-01-21 MED ORDER — MONTELUKAST SODIUM 10 MG PO TABS: ORAL_TABLET | ORAL | 0 refills | Status: AC

## 2018-01-27 DIAGNOSIS — J309 Allergic rhinitis, unspecified: Secondary | ICD-10-CM | POA: Diagnosis not present

## 2018-01-27 DIAGNOSIS — Z6827 Body mass index (BMI) 27.0-27.9, adult: Secondary | ICD-10-CM | POA: Diagnosis not present

## 2018-01-27 DIAGNOSIS — J40 Bronchitis, not specified as acute or chronic: Secondary | ICD-10-CM | POA: Diagnosis not present

## 2018-01-27 DIAGNOSIS — J329 Chronic sinusitis, unspecified: Secondary | ICD-10-CM | POA: Diagnosis not present

## 2018-02-17 DIAGNOSIS — E785 Hyperlipidemia, unspecified: Secondary | ICD-10-CM | POA: Diagnosis not present

## 2018-02-17 DIAGNOSIS — F4320 Adjustment disorder, unspecified: Secondary | ICD-10-CM | POA: Diagnosis not present

## 2018-02-17 DIAGNOSIS — Z6827 Body mass index (BMI) 27.0-27.9, adult: Secondary | ICD-10-CM | POA: Diagnosis not present

## 2018-04-09 ENCOUNTER — Ambulatory Visit (INDEPENDENT_AMBULATORY_CARE_PROVIDER_SITE_OTHER): Payer: Medicare Other | Admitting: Vascular Surgery

## 2018-04-09 ENCOUNTER — Ambulatory Visit (HOSPITAL_COMMUNITY)
Admission: RE | Admit: 2018-04-09 | Discharge: 2018-04-09 | Disposition: A | Discharge status: Home / Self Care | Payer: Medicare Other | Source: Ambulatory Visit | Attending: Vascular Surgery | Admitting: Vascular Surgery

## 2018-04-09 ENCOUNTER — Other Ambulatory Visit: Payer: Self-pay

## 2018-04-09 ENCOUNTER — Encounter: Payer: Self-pay | Admitting: Vascular Surgery

## 2018-04-09 VITALS — BP 115/79 | HR 67 | Temp 97.5°F | Resp 16 | Ht 70.0 in | Wt 187.0 lb

## 2018-04-09 DIAGNOSIS — I6522 Occlusion and stenosis of left carotid artery: Secondary | ICD-10-CM | POA: Insufficient documentation

## 2018-04-09 DIAGNOSIS — Z9889 Other specified postprocedural states: Secondary | ICD-10-CM | POA: Diagnosis not present

## 2018-04-09 DIAGNOSIS — I6521 Occlusion and stenosis of right carotid artery: Secondary | ICD-10-CM
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
## 2018-05-05 ENCOUNTER — Other Ambulatory Visit: Payer: Self-pay

## 2018-05-05 DIAGNOSIS — I6521 Occlusion and stenosis of right carotid artery: Secondary | ICD-10-CM

## 2018-05-13 DIAGNOSIS — R5383 Other fatigue: Secondary | ICD-10-CM | POA: Diagnosis not present

## 2018-05-13 DIAGNOSIS — Z79899 Other long term (current) drug therapy: Secondary | ICD-10-CM | POA: Diagnosis not present

## 2018-05-13 DIAGNOSIS — H6592 Unspecified nonsuppurative otitis media, left ear: Secondary | ICD-10-CM | POA: Diagnosis not present

## 2018-06-03 DIAGNOSIS — Z6827 Body mass index (BMI) 27.0-27.9, adult: Secondary | ICD-10-CM | POA: Diagnosis not present

## 2018-06-03 DIAGNOSIS — Z1339 Encounter for screening examination for other mental health and behavioral disorders: Secondary | ICD-10-CM | POA: Diagnosis not present

## 2018-06-03 DIAGNOSIS — H6592 Unspecified nonsuppurative otitis media, left ear: Secondary | ICD-10-CM | POA: Diagnosis not present

## 2018-06-03 DIAGNOSIS — N529 Male erectile dysfunction, unspecified: Secondary | ICD-10-CM | POA: Diagnosis not present

## 2018-06-17 DIAGNOSIS — B353 Tinea pedis: Secondary | ICD-10-CM | POA: Diagnosis not present

## 2018-06-17 DIAGNOSIS — B351 Tinea unguium: Secondary | ICD-10-CM | POA: Insufficient documentation

## 2018-06-17 DIAGNOSIS — M6702 Short Achilles tendon (acquired), left ankle: Secondary | ICD-10-CM | POA: Diagnosis not present

## 2018-06-17 DIAGNOSIS — M722 Plantar fascial fibromatosis: Secondary | ICD-10-CM | POA: Insufficient documentation

## 2018-06-17 DIAGNOSIS — M7732 Calcaneal spur, left foot: Secondary | ICD-10-CM | POA: Diagnosis not present

## 2018-06-21 ENCOUNTER — Encounter (HOSPITAL_COMMUNITY): Payer: Self-pay

## 2018-06-21 ENCOUNTER — Emergency Department (HOSPITAL_COMMUNITY)
Admission: EM | Admit: 2018-06-21 | Discharge: 2018-06-22 | Disposition: A | Discharge status: Home / Self Care | Payer: Medicare Other | Attending: Emergency Medicine | Admitting: Emergency Medicine

## 2018-06-21 DIAGNOSIS — I10 Essential (primary) hypertension: Secondary | ICD-10-CM | POA: Insufficient documentation

## 2018-06-21 DIAGNOSIS — Z79899 Other long term (current) drug therapy: Secondary | ICD-10-CM | POA: Insufficient documentation

## 2018-06-21 DIAGNOSIS — N132 Hydronephrosis with renal and ureteral calculous obstruction: Secondary | ICD-10-CM | POA: Diagnosis not present

## 2018-06-21 DIAGNOSIS — R1032 Left lower quadrant pain: Secondary | ICD-10-CM | POA: Diagnosis not present

## 2018-06-21 DIAGNOSIS — Z87891 Personal history of nicotine dependence: Secondary | ICD-10-CM | POA: Diagnosis not present

## 2018-06-21 DIAGNOSIS — Z7982 Long term (current) use of aspirin: Secondary | ICD-10-CM | POA: Diagnosis not present

## 2018-06-21 DIAGNOSIS — N200 Calculus of kidney: Secondary | ICD-10-CM | POA: Diagnosis not present

## 2018-06-21 LAB — CBC
HEMATOCRIT: 42 % (ref 39.0–52.0)
Hemoglobin: 14 g/dL (ref 13.0–17.0)
MCH: 31.7 pg (ref 26.0–34.0)
MCHC: 33.3 g/dL (ref 30.0–36.0)
MCV: 95.2 fL (ref 78.0–100.0)
Platelets: 229 10*3/uL (ref 150–400)
RBC: 4.41 MIL/uL (ref 4.22–5.81)
RDW: 11.9 % (ref 11.5–15.5)
WBC: 6.4 10*3/uL (ref 4.0–10.5)

## 2018-06-21 MED ORDER — SODIUM CHLORIDE 0.9 % IV BOLUS (SEPSIS)
1000.0000 mL | Freq: Once | INTRAVENOUS | Status: AC
  Administered 2018-06-21: 1000 mL via INTRAVENOUS

## 2018-06-21 MED ORDER — ONDANSETRON HCL 4 MG/2ML IJ SOLN
4.0000 mg | Freq: Once | INTRAMUSCULAR | Status: AC
  Administered 2018-06-21: 4 mg via INTRAVENOUS
  Filled 2018-06-21: qty 2

## 2018-06-21 MED ORDER — MORPHINE SULFATE (PF) 4 MG/ML IV SOLN
4.0000 mg | Freq: Once | INTRAVENOUS | Status: AC
  Administered 2018-06-21: 4 mg via INTRAVENOUS
  Filled 2018-06-21: qty 1

## 2018-06-21 NOTE — ED Triage Notes (Signed)
Pt states that around an hour ago he began to have severe LLQ abd pain with nausea. Hx of stones states this feels different.

## 2018-06-22 ENCOUNTER — Emergency Department (HOSPITAL_COMMUNITY): Payer: Medicare Other

## 2018-06-22 DIAGNOSIS — N132 Hydronephrosis with renal and ureteral calculous obstruction: Secondary | ICD-10-CM | POA: Diagnosis not present

## 2018-06-22 DIAGNOSIS — R1032 Left lower quadrant pain: Secondary | ICD-10-CM | POA: Diagnosis not present

## 2018-06-22 LAB — COMPREHENSIVE METABOLIC PANEL
ALT: 22 U/L (ref 0–44)
AST: 18 U/L (ref 15–41)
Albumin: 3.8 g/dL (ref 3.5–5.0)
Alkaline Phosphatase: 97 U/L (ref 38–126)
Anion gap: 9 (ref 5–15)
BUN: 19 mg/dL (ref 8–23)
CO2: 25 mmol/L (ref 22–32)
Calcium: 9.6 mg/dL (ref 8.9–10.3)
Chloride: 106 mmol/L (ref 98–111)
Creatinine, Ser: 0.99 mg/dL (ref 0.61–1.24)
GFR calc Af Amer: >60 mL/min (ref >60)
GFR calc non Af Amer: >60 mL/min (ref >60)
Glucose, Bld: 163 mg/dL — ABNORMAL HIGH (ref 70–99)
Potassium: 3.7 mmol/L (ref 3.5–5.1)
Sodium: 140 mmol/L (ref 135–145)
Total Bilirubin: 0.9 mg/dL (ref 0.3–1.2)
Total Protein: 6.7 g/dL (ref 6.5–8.1)

## 2018-06-22 LAB — URINALYSIS, ROUTINE W REFLEX MICROSCOPIC
Bacteria, UA: NONE SEEN
Bilirubin Urine: NEGATIVE
Glucose, UA: NEGATIVE mg/dL
Ketones, ur: NEGATIVE mg/dL
Leukocytes, UA: NEGATIVE
Nitrite: NEGATIVE
Protein, ur: NEGATIVE mg/dL
RBC / HPF: >50 RBC/hpf — ABNORMAL HIGH (ref 0–5)
Specific Gravity, Urine: 1.014 (ref 1.005–1.030)
pH: 7 (ref 5.0–8.0)

## 2018-06-22 LAB — LIPASE, BLOOD: Lipase: 39 U/L (ref 11–51)

## 2018-06-22 MED ORDER — KETOROLAC TROMETHAMINE 30 MG/ML IJ SOLN
30.0000 mg | Freq: Once | INTRAMUSCULAR | Status: AC
  Administered 2018-06-22: 30 mg via INTRAVENOUS
  Filled 2018-06-22: qty 1

## 2018-06-22 MED ORDER — OXYCODONE-ACETAMINOPHEN 5-325 MG PO TABS
ORAL_TABLET | ORAL | Status: AC
  Filled 2018-06-22: qty 1

## 2018-06-22 MED ORDER — OXYCODONE-ACETAMINOPHEN 5-325 MG PO TABS: 1.0000 | ORAL_TABLET | Freq: Four times a day (QID) | ORAL | 0 refills | Status: DC | PRN

## 2018-06-22 MED ORDER — MORPHINE SULFATE (PF) 4 MG/ML IV SOLN
4.0000 mg | Freq: Once | INTRAVENOUS | Status: AC
  Administered 2018-06-22: 4 mg via INTRAVENOUS
  Filled 2018-06-22: qty 1

## 2018-06-22 MED ORDER — TAMSULOSIN HCL 0.4 MG PO CAPS: 0.4000 mg | ORAL_CAPSULE | Freq: Every day | ORAL | 0 refills | Status: AC

## 2018-06-22 MED ORDER — ONDANSETRON 4 MG PO TBDP: 4.0000 mg | ORAL_TABLET | Freq: Three times a day (TID) | ORAL | 0 refills | Status: DC | PRN

## 2018-06-22 MED ORDER — OXYCODONE-ACETAMINOPHEN 5-325 MG PO TABS: 1.0000 | ORAL_TABLET | Freq: Once | ORAL | Status: DC

## 2018-06-22 NOTE — Discharge Instructions (Addendum)
You were seen today for a kidney stone.  Take pain and nausea medication at home as needed.  Make sure to stay hydrated.  Follow-up with urology.  Your stone is approximately 7 mm.  This may pass on its own; however, if it does not pass on its own, you may need stent placement.  If you develop fevers, urinary symptoms, any new or worsening symptoms you should be reevaluated.

## 2018-06-22 NOTE — ED Notes (Signed)
Patient transported to CT 

## 2018-06-22 NOTE — ED Notes (Signed)
Pt refused, Art gallery managerCandice RN witnessed Therapist, artN waste medication
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
## 2018-07-06 DIAGNOSIS — M6702 Short Achilles tendon (acquired), left ankle: Secondary | ICD-10-CM | POA: Diagnosis not present

## 2018-07-06 DIAGNOSIS — B351 Tinea unguium: Secondary | ICD-10-CM | POA: Diagnosis not present

## 2018-07-06 DIAGNOSIS — M722 Plantar fascial fibromatosis: Secondary | ICD-10-CM | POA: Diagnosis not present

## 2018-07-06 DIAGNOSIS — B353 Tinea pedis: Secondary | ICD-10-CM | POA: Diagnosis not present

## 2018-08-27 DIAGNOSIS — Z23 Encounter for immunization: Secondary | ICD-10-CM | POA: Diagnosis not present

## 2018-09-24 ENCOUNTER — Ambulatory Visit: Payer: Medicare Other | Admitting: Vascular Surgery

## 2018-09-24 ENCOUNTER — Encounter (HOSPITAL_COMMUNITY): Payer: Medicare Other

## 2018-11-04 DIAGNOSIS — Z23 Encounter for immunization: Secondary | ICD-10-CM | POA: Diagnosis not present

## 2018-11-12 ENCOUNTER — Encounter: Payer: Self-pay | Admitting: Vascular Surgery

## 2018-11-12 ENCOUNTER — Other Ambulatory Visit: Payer: Self-pay

## 2018-11-12 ENCOUNTER — Ambulatory Visit (INDEPENDENT_AMBULATORY_CARE_PROVIDER_SITE_OTHER): Payer: Medicare Other | Admitting: Vascular Surgery

## 2018-11-12 ENCOUNTER — Ambulatory Visit (HOSPITAL_COMMUNITY)
Admission: RE | Admit: 2018-11-12 | Discharge: 2018-11-12 | Disposition: A | Discharge status: Home / Self Care | Payer: Medicare Other | Source: Ambulatory Visit | Attending: Vascular Surgery | Admitting: Vascular Surgery

## 2018-11-12 VITALS — BP 112/74 | HR 72 | Temp 97.8°F | Resp 20 | Ht 70.0 in | Wt 185.0 lb

## 2018-11-12 DIAGNOSIS — I6521 Occlusion and stenosis of right carotid artery: Secondary | ICD-10-CM | POA: Diagnosis not present
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
## 2018-12-02 DIAGNOSIS — Z0000 Encounter for general adult medical examination without abnormal findings: Secondary | ICD-10-CM | POA: Diagnosis not present

## 2018-12-02 DIAGNOSIS — E785 Hyperlipidemia, unspecified: Secondary | ICD-10-CM | POA: Diagnosis not present

## 2018-12-02 DIAGNOSIS — J309 Allergic rhinitis, unspecified: Secondary | ICD-10-CM | POA: Diagnosis not present

## 2018-12-02 DIAGNOSIS — Z79899 Other long term (current) drug therapy: Secondary | ICD-10-CM | POA: Diagnosis not present

## 2018-12-02 DIAGNOSIS — I10 Essential (primary) hypertension: Secondary | ICD-10-CM | POA: Diagnosis not present

## 2018-12-02 DIAGNOSIS — Z125 Encounter for screening for malignant neoplasm of prostate: Secondary | ICD-10-CM | POA: Diagnosis not present
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
## 2019-08-03 DIAGNOSIS — Z23 Encounter for immunization: Secondary | ICD-10-CM | POA: Diagnosis not present

## 2019-08-22 DIAGNOSIS — R319 Hematuria, unspecified: Secondary | ICD-10-CM | POA: Diagnosis not present

## 2019-08-22 DIAGNOSIS — N390 Urinary tract infection, site not specified: Secondary | ICD-10-CM | POA: Diagnosis not present

## 2019-08-22 DIAGNOSIS — R109 Unspecified abdominal pain: Secondary | ICD-10-CM | POA: Diagnosis not present

## 2019-08-22 DIAGNOSIS — R0989 Other specified symptoms and signs involving the circulatory and respiratory systems: Secondary | ICD-10-CM | POA: Diagnosis not present

## 2019-12-26 ENCOUNTER — Encounter: Payer: Self-pay | Admitting: Gastroenterology

## 2019-12-29 ENCOUNTER — Other Ambulatory Visit: Payer: Self-pay

## 2019-12-29 DIAGNOSIS — I6521 Occlusion and stenosis of right carotid artery: Secondary | ICD-10-CM

## 2019-12-30 ENCOUNTER — Ambulatory Visit (HOSPITAL_COMMUNITY)
Admission: RE | Admit: 2019-12-30 | Discharge: 2019-12-30 | Disposition: A | Discharge status: Home / Self Care | Payer: Medicare Other | Source: Ambulatory Visit | Attending: Vascular Surgery | Admitting: Vascular Surgery

## 2019-12-30 ENCOUNTER — Other Ambulatory Visit: Payer: Self-pay

## 2019-12-30 ENCOUNTER — Ambulatory Visit (INDEPENDENT_AMBULATORY_CARE_PROVIDER_SITE_OTHER): Payer: Medicare Other | Admitting: Vascular Surgery

## 2019-12-30 ENCOUNTER — Encounter: Payer: Self-pay | Admitting: Vascular Surgery

## 2019-12-30 VITALS — BP 122/74 | HR 67 | Temp 97.7°F | Resp 20 | Ht 70.0 in | Wt 191.0 lb

## 2019-12-30 DIAGNOSIS — I6521 Occlusion and stenosis of right carotid artery: Secondary | ICD-10-CM
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
## 2020-01-02 ENCOUNTER — Other Ambulatory Visit: Payer: Self-pay | Admitting: *Deleted

## 2020-01-02 DIAGNOSIS — I6521 Occlusion and stenosis of right carotid artery: Secondary | ICD-10-CM

## 2020-01-16 ENCOUNTER — Encounter: Payer: Self-pay | Admitting: Gastroenterology

## 2020-02-23 ENCOUNTER — Ambulatory Visit (AMBULATORY_SURGERY_CENTER): Payer: Medicare Other | Admitting: *Deleted

## 2020-02-23 ENCOUNTER — Other Ambulatory Visit: Payer: Self-pay

## 2020-02-23 VITALS — Ht 70.0 in | Wt 183.0 lb

## 2020-02-23 DIAGNOSIS — Z800 Family history of malignant neoplasm of digestive organs: Secondary | ICD-10-CM

## 2020-02-23 NOTE — Progress Notes (Signed)
Pt verified name, DOB, address and insurance during PV today. Pt mailed instruction packet to included paper to complete and mail back to [facility] with addressed and stamped envelope, Emmi video, copy of consent form to read and not return, and instructions.PV completed over the phone. Pt encouraged to call with questions or issues   Pt will complete COVID vaccines 03-10-20 and RS his colon to 03-30-2020 so he does not have to have COV screen - will be >14 days after the 2nd vaccine   No egg or soy allergy known to patient  No issues with past sedation with any surgeries  or procedures, no intubation problems  No diet pills per patient No home 02 use per patient  No blood thinners per patient  Pt denies issues with constipation except if he uses ibuprofen for days but routinely no issues - does have a past issue with constipation but after stooped ibuprofen no issues  No A fib or A flutter  EMMI video sent to pt's e mail   Due to the COVID-19 pandemic we are asking patients to follow these guidelines. Please only bring one care partner. Please be aware that your care partner may wait in the car in the parking lot or if they feel like they will be too hot to wait in the car, they may wait in the lobby on the 4th floor. All care partners are required to wear a mask the entire time (we do not have any that we can provide them), they need to practice social distancing, and we will do a Covid check for all patient's and care partners when you arrive. Also we will check their temperature and your temperature. If the care partner waits in their car they need to stay in the parking lot the entire time and we will call them on their cell phone when the patient is ready for discharge so they can bring the car to the front of the building. Also all patient's will need to wear a mask into building.

## 2020-03-01 ENCOUNTER — Telehealth: Payer: Self-pay | Admitting: Gastroenterology

## 2020-03-01 ENCOUNTER — Encounter: Payer: Self-pay | Admitting: Gastroenterology

## 2020-03-01 NOTE — Telephone Encounter (Signed)
Pt called stating that appt was changed due to the fact that he was having his vaccine done now he is having his vaccine on March 12, 2020  wants to know if this ok to leave procedure scheduled for 03-30-20 or does he need to change it? Pt stated Hilda Lias stated had something to do with Sedation of procedure and vaccine.

## 2020-03-01 NOTE — Telephone Encounter (Signed)
All questions answered- medical hx clarified- brother had bladder CA not colon CA

## 2020-03-09 ENCOUNTER — Encounter: Payer: Medicare Other | Admitting: Gastroenterology

## 2020-03-27 ENCOUNTER — Telehealth: Payer: Self-pay | Admitting: *Deleted

## 2020-03-27 DIAGNOSIS — Z01818 Encounter for other preprocedural examination: Secondary | ICD-10-CM

## 2020-03-27 NOTE — Telephone Encounter (Signed)
Pt states he elected to not have his second covid vaccine.  I told him he must get a preprocedure covid test in order to have his procedure- covid test scheduled for 03-28-20 at 930.  Address of location given to pt

## 2020-03-28 ENCOUNTER — Ambulatory Visit (INDEPENDENT_AMBULATORY_CARE_PROVIDER_SITE_OTHER): Payer: Medicare Other

## 2020-03-28 DIAGNOSIS — Z1159 Encounter for screening for other viral diseases: Secondary | ICD-10-CM

## 2020-03-28 LAB — SARS CORONAVIRUS 2 (TAT 6-24 HRS): SARS Coronavirus 2: NEGATIVE

## 2020-03-30 ENCOUNTER — Other Ambulatory Visit: Payer: Self-pay

## 2020-03-30 ENCOUNTER — Ambulatory Visit (AMBULATORY_SURGERY_CENTER): Payer: Medicare Other | Admitting: Gastroenterology

## 2020-03-30 ENCOUNTER — Encounter: Payer: Self-pay | Admitting: Gastroenterology

## 2020-03-30 VITALS — BP 120/71 | HR 63 | Temp 96.6°F | Resp 18 | Ht 70.0 in | Wt 183.0 lb

## 2020-03-30 DIAGNOSIS — Z1211 Encounter for screening for malignant neoplasm of colon: Secondary | ICD-10-CM

## 2020-03-30 DIAGNOSIS — Z800 Family history of malignant neoplasm of digestive organs: Secondary | ICD-10-CM

## 2020-03-30 MED ORDER — SODIUM CHLORIDE 0.9 % IV SOLN: 500.0000 mL | Freq: Once | INTRAVENOUS | Status: DC

## 2020-03-30 NOTE — Op Note (Signed)
[facility] Patient Name: Jose Wolf Procedure Date: 03/30/2020 1:29 PM MRN: [id number] Endoscopist: Lynann Bologna , MD Age: 69 Referring MD:  Date of Birth: 1951-07-13 Gender: Male Account #: [id number] Procedure:                Colonoscopy Indications:              Screening for colorectal malignant neoplasm Medicines:                Monitored Anesthesia Care Procedure:                Pre-Anesthesia Assessment:                           - Prior to the procedure, a History and Physical                            was performed, and patient medications and                            allergies were reviewed. The patient's tolerance of                            previous anesthesia was also reviewed. The risks                            and benefits of the procedure and the sedation                            options and risks were discussed with the patient.                            All questions were answered, and informed consent                            was obtained. Prior Anticoagulants: The patient has                            taken no previous anticoagulant or antiplatelet                            agents. ASA Grade Assessment: II - A patient with                            mild systemic disease. After reviewing the risks                            and benefits, the patient was deemed in                            satisfactory condition to undergo the procedure.                           After obtaining informed consent, the colonoscope  was passed under direct vision. Throughout the                            procedure, the patient's blood pressure, pulse, and                            oxygen saturations were monitored continuously. The                            Colonoscope was introduced through the anus and                            advanced to the 2 cm into the ileum. The                            colonoscopy was performed  without difficulty. The                            patient tolerated the procedure well. The quality                            of the bowel preparation was good. The terminal                            ileum, ileocecal valve, appendiceal orifice, and                            rectum were photographed. Scope In: 1:44:54 PM Scope Out: 1:55:51 PM Scope Withdrawal Time: 0 hours 8 minutes 8 seconds  Total Procedure Duration: 0 hours 10 minutes 57 seconds  Findings:                 A few small-mouthed diverticula were found in the                            sigmoid colon and ascending colon.                           Non-bleeding internal hemorrhoids were found during                            retroflexion. The hemorrhoids were small.                           The terminal ileum appeared normal.                           The exam was otherwise without abnormality on                            direct and retroflexion views. Complications:            No immediate complications. Estimated Blood Loss:     Estimated blood loss: none. Impression:               -Mild colonic diverticulosis.                           -  Otherwise normal colonoscopy to TI.                           - No specimens collected. Recommendation:           - Patient has a contact number available for                            emergencies. The signs and symptoms of potential                            delayed complications were discussed with the                            patient. Return to normal activities tomorrow.                            Written discharge instructions were provided to the                            patient.                           - Resume previous diet.                           - Continue present medications.                           - Repeat colonoscopy in 10 years for screening                            purposes. Earlier, if with any new problems or if                            there is  any change in family history.                           - Return to GI clinic PRN.                           - The findings and recommendations were discussed                            with the patient's family. Jackquline Denmark, MD 03/30/2020 2:06:00 PM This report has been signed electronically.

## 2020-03-30 NOTE — Patient Instructions (Signed)
Information on hemorrhoids and diverticulosis given to you today.  Continue present medication and resume previous diet.  Repeat colonoscopy in 10 years for screening purposes.  YOU HAD AN ENDOSCOPIC PROCEDURE TODAY AT THE [facility]:   Refer to the procedure report that was given to you for any specific questions about what was found during the examination.  If the procedure report does not answer your questions, please call your gastroenterologist to clarify.  If you requested that your care partner not be given the details of your procedure findings, then the procedure report has been included in a sealed envelope for you to review at your convenience later.  YOU SHOULD EXPECT: Some feelings of bloating in the abdomen. Passage of more gas than usual.  Walking can help get rid of the air that was put into your GI tract during the procedure and reduce the bloating. If you had a lower endoscopy (such as a colonoscopy or flexible sigmoidoscopy) you may notice spotting of blood in your stool or on the toilet paper. If you underwent a bowel prep for your procedure, you may not have a normal bowel movement for a few days.  Please Note:  You might notice some irritation and congestion in your nose or some drainage.  This is from the oxygen used during your procedure.  There is no need for concern and it should clear up in a day or so.  SYMPTOMS TO REPORT IMMEDIATELY:   Following lower endoscopy (colonoscopy or flexible sigmoidoscopy):  Excessive amounts of blood in the stool  Significant tenderness or worsening of abdominal pains  Swelling of the abdomen that is new, acute  Fever of 100F or higher  For urgent or emergent issues, a gastroenterologist can be reached at any hour by calling [phone]. Do not use MyChart messaging for urgent concerns.    DIET:  We do recommend a small meal at first, but then you may proceed to your regular diet.  Drink plenty of fluids but you  should avoid alcoholic beverages for 24 hours.  ACTIVITY:  You should plan to take it easy for the rest of today and you should NOT DRIVE or use heavy machinery until tomorrow (because of the sedation medicines used during the test).    FOLLOW UP: Our staff will call the number listed on your records 48-72 hours following your procedure to check on you and address any questions or concerns that you may have regarding the information given to you following your procedure. If we do not reach you, we will leave a message.  We will attempt to reach you two times.  During this call, we will ask if you have developed any symptoms of COVID 19. If you develop any symptoms (ie: fever, flu-like symptoms, shortness of breath, cough etc.) before then, please call [phone].  If you test positive for Covid 19 in the 2 weeks post procedure, please call and report this information to [place].    If any biopsies were taken you will be contacted by phone or by letter within the next 1-3 weeks.  Please call us at [phone] if you have not heard about the biopsies in 3 weeks.    SIGNATURES/CONFIDENTIALITY: You and/or your care partner have signed paperwork which will be entered into your electronic medical record.  These signatures attest to the fact that that the information above on your After Visit Summary has been reviewed and is understood.  Full responsibility of the confidentiality of this  discharge information lies with you and/or your care-partner. 

## 2020-03-30 NOTE — Progress Notes (Signed)
Vitals-DT Temp-JB  Pt's states no medical or surgical changes since previsit or office visit. 

## 2020-03-30 NOTE — Progress Notes (Signed)
To PACU, VSS. Report to [email or web address] 

## 2020-04-03 ENCOUNTER — Telehealth: Payer: Self-pay

## 2020-04-03 NOTE — Telephone Encounter (Signed)
  Follow up Call-  Call back number 03/30/2020  Post procedure Call Back phone  # [phone]  Permission to leave phone message Yes  Some recent data might be hidden     Patient questions:  Do you have a fever, pain , or abdominal swelling? No. Pain Score  0 *  Have you tolerated food without any problems? Yes.    Have you been able to return to your normal activities? Yes.    Do you have any questions about your discharge instructions: Diet   No. Medications  No. Follow up visit  No.  Do you have questions or concerns about your Care? No.  Actions: * If pain score is 4 or above: No action needed, pain <4.  1. Have you developed a fever since your procedure? no  2.   Have you had an respiratory symptoms (SOB or cough) since your procedure? no  3.   Have you tested positive for COVID 19 since your procedure no  4.   Have you had any family members/close contacts diagnosed with the COVID 19 since your procedure?  No  Pt said he had a colonoscopy in [place] previously.  He reported his experience here at [facility] was much better.  maw   If yes to any of these questions please route to Laverna Peace, RN and Charlett Lango, RN

## 2020-08-31 DIAGNOSIS — Z23 Encounter for immunization: Secondary | ICD-10-CM | POA: Diagnosis not present

## 2020-09-04 DIAGNOSIS — I10 Essential (primary) hypertension: Secondary | ICD-10-CM | POA: Diagnosis not present

## 2020-09-04 DIAGNOSIS — E785 Hyperlipidemia, unspecified: Secondary | ICD-10-CM | POA: Diagnosis not present

## 2020-09-04 DIAGNOSIS — Z79899 Other long term (current) drug therapy: Secondary | ICD-10-CM | POA: Diagnosis not present

## 2020-09-04 DIAGNOSIS — Z125 Encounter for screening for malignant neoplasm of prostate: Secondary | ICD-10-CM | POA: Diagnosis not present

## 2020-09-04 DIAGNOSIS — Z0000 Encounter for general adult medical examination without abnormal findings: Secondary | ICD-10-CM | POA: Diagnosis not present

## 2020-09-04 DIAGNOSIS — F4320 Adjustment disorder, unspecified: Secondary | ICD-10-CM | POA: Diagnosis not present

## 2020-09-04 DIAGNOSIS — I739 Peripheral vascular disease, unspecified: Secondary | ICD-10-CM | POA: Diagnosis not present

## 2020-09-10 DIAGNOSIS — S0501XA Injury of conjunctiva and corneal abrasion without foreign body, right eye, initial encounter: Secondary | ICD-10-CM | POA: Diagnosis not present

## 2020-11-22 DIAGNOSIS — J309 Allergic rhinitis, unspecified: Secondary | ICD-10-CM | POA: Diagnosis not present

## 2020-11-22 DIAGNOSIS — E785 Hyperlipidemia, unspecified: Secondary | ICD-10-CM | POA: Diagnosis not present

## 2020-11-22 DIAGNOSIS — I10 Essential (primary) hypertension: Secondary | ICD-10-CM | POA: Diagnosis not present

## 2021-01-16 ENCOUNTER — Ambulatory Visit (INDEPENDENT_AMBULATORY_CARE_PROVIDER_SITE_OTHER): Payer: Medicare Other | Admitting: Physician Assistant

## 2021-01-16 ENCOUNTER — Other Ambulatory Visit: Payer: Self-pay

## 2021-01-16 ENCOUNTER — Ambulatory Visit (HOSPITAL_COMMUNITY)
Admission: RE | Admit: 2021-01-16 | Discharge: 2021-01-16 | Disposition: A | Discharge status: Home / Self Care | Payer: Medicare Other | Source: Ambulatory Visit | Attending: Vascular Surgery | Admitting: Vascular Surgery

## 2021-01-16 VITALS — BP 121/74 | HR 67 | Temp 98.7°F | Resp 20 | Ht 70.0 in | Wt 192.5 lb

## 2021-01-16 DIAGNOSIS — I6523 Occlusion and stenosis of bilateral carotid arteries: Secondary | ICD-10-CM | POA: Diagnosis not present

## 2021-01-16 DIAGNOSIS — I6521 Occlusion and stenosis of right carotid artery: Secondary | ICD-10-CM | POA: Diagnosis not present
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
## 2021-03-23 DIAGNOSIS — I10 Essential (primary) hypertension: Secondary | ICD-10-CM | POA: Diagnosis not present

## 2021-03-23 DIAGNOSIS — J309 Allergic rhinitis, unspecified: Secondary | ICD-10-CM | POA: Diagnosis not present

## 2021-03-23 DIAGNOSIS — E785 Hyperlipidemia, unspecified: Secondary | ICD-10-CM | POA: Diagnosis not present

## 2021-04-22 DIAGNOSIS — I10 Essential (primary) hypertension: Secondary | ICD-10-CM | POA: Diagnosis not present

## 2021-04-22 DIAGNOSIS — E785 Hyperlipidemia, unspecified: Secondary | ICD-10-CM | POA: Diagnosis not present

## 2021-04-22 DIAGNOSIS — J309 Allergic rhinitis, unspecified: Secondary | ICD-10-CM | POA: Diagnosis not present

## 2021-05-23 DIAGNOSIS — J309 Allergic rhinitis, unspecified: Secondary | ICD-10-CM | POA: Diagnosis not present

## 2021-05-23 DIAGNOSIS — E785 Hyperlipidemia, unspecified: Secondary | ICD-10-CM | POA: Diagnosis not present

## 2021-05-23 DIAGNOSIS — I10 Essential (primary) hypertension: Secondary | ICD-10-CM | POA: Diagnosis not present

## 2021-06-23 DIAGNOSIS — I10 Essential (primary) hypertension: Secondary | ICD-10-CM | POA: Diagnosis not present

## 2021-06-23 DIAGNOSIS — E785 Hyperlipidemia, unspecified: Secondary | ICD-10-CM | POA: Diagnosis not present

## 2021-06-23 DIAGNOSIS — J309 Allergic rhinitis, unspecified: Secondary | ICD-10-CM | POA: Diagnosis not present

## 2021-06-24 DIAGNOSIS — Z041 Encounter for examination and observation following transport accident: Secondary | ICD-10-CM | POA: Diagnosis not present

## 2021-06-24 DIAGNOSIS — Z5321 Procedure and treatment not carried out due to patient leaving prior to being seen by health care provider: Secondary | ICD-10-CM | POA: Diagnosis not present

## 2021-07-24 DIAGNOSIS — I10 Essential (primary) hypertension: Secondary | ICD-10-CM | POA: Diagnosis not present

## 2021-07-24 DIAGNOSIS — J309 Allergic rhinitis, unspecified: Secondary | ICD-10-CM | POA: Diagnosis not present

## 2021-07-24 DIAGNOSIS — E785 Hyperlipidemia, unspecified: Secondary | ICD-10-CM | POA: Diagnosis not present

## 2021-09-19 DIAGNOSIS — E785 Hyperlipidemia, unspecified: Secondary | ICD-10-CM | POA: Diagnosis not present

## 2021-09-19 DIAGNOSIS — Z125 Encounter for screening for malignant neoplasm of prostate: Secondary | ICD-10-CM | POA: Diagnosis not present

## 2021-09-19 DIAGNOSIS — Z0000 Encounter for general adult medical examination without abnormal findings: Secondary | ICD-10-CM | POA: Diagnosis not present

## 2021-09-19 DIAGNOSIS — Z79899 Other long term (current) drug therapy: Secondary | ICD-10-CM | POA: Diagnosis not present

## 2021-09-19 DIAGNOSIS — F4320 Adjustment disorder, unspecified: Secondary | ICD-10-CM | POA: Diagnosis not present

## 2021-09-23 DIAGNOSIS — I739 Peripheral vascular disease, unspecified: Secondary | ICD-10-CM | POA: Diagnosis not present

## 2021-09-23 DIAGNOSIS — I10 Essential (primary) hypertension: Secondary | ICD-10-CM | POA: Diagnosis not present

## 2021-09-23 DIAGNOSIS — E785 Hyperlipidemia, unspecified: Secondary | ICD-10-CM | POA: Diagnosis not present

## 2021-10-04 DIAGNOSIS — Z23 Encounter for immunization: Secondary | ICD-10-CM | POA: Diagnosis not present

## 2021-10-23 DIAGNOSIS — F4320 Adjustment disorder, unspecified: Secondary | ICD-10-CM | POA: Diagnosis not present

## 2021-10-23 DIAGNOSIS — E785 Hyperlipidemia, unspecified: Secondary | ICD-10-CM | POA: Diagnosis not present

## 2021-12-24 DIAGNOSIS — F4320 Adjustment disorder, unspecified: Secondary | ICD-10-CM | POA: Diagnosis not present

## 2021-12-24 DIAGNOSIS — E785 Hyperlipidemia, unspecified: Secondary | ICD-10-CM | POA: Diagnosis not present

## 2022-02-05 DIAGNOSIS — M545 Low back pain, unspecified: Secondary | ICD-10-CM | POA: Diagnosis not present

## 2022-02-05 DIAGNOSIS — S39012A Strain of muscle, fascia and tendon of lower back, initial encounter: Secondary | ICD-10-CM | POA: Diagnosis not present

## 2022-02-05 DIAGNOSIS — M546 Pain in thoracic spine: Secondary | ICD-10-CM | POA: Diagnosis not present

## 2022-02-21 DIAGNOSIS — I10 Essential (primary) hypertension: Secondary | ICD-10-CM | POA: Diagnosis not present

## 2022-02-21 DIAGNOSIS — E785 Hyperlipidemia, unspecified: Secondary | ICD-10-CM | POA: Diagnosis not present

## 2022-02-21 DIAGNOSIS — I739 Peripheral vascular disease, unspecified: Secondary | ICD-10-CM | POA: Diagnosis not present

## 2022-02-23 ENCOUNTER — Other Ambulatory Visit: Payer: Self-pay

## 2022-02-23 DIAGNOSIS — I6523 Occlusion and stenosis of bilateral carotid arteries: Secondary | ICD-10-CM
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
## 2022-03-05 ENCOUNTER — Ambulatory Visit (HOSPITAL_COMMUNITY)
Admission: RE | Admit: 2022-03-05 | Discharge: 2022-03-05 | Disposition: A | Discharge status: Home / Self Care | Payer: Medicare Other | Source: Ambulatory Visit | Attending: Vascular Surgery | Admitting: Vascular Surgery

## 2022-03-05 ENCOUNTER — Encounter: Payer: Self-pay | Admitting: Physician Assistant

## 2022-03-05 ENCOUNTER — Ambulatory Visit (INDEPENDENT_AMBULATORY_CARE_PROVIDER_SITE_OTHER): Payer: Medicare Other | Admitting: Physician Assistant

## 2022-03-05 VITALS — BP 122/72 | HR 59 | Temp 97.5°F | Ht 70.0 in | Wt 180.5 lb

## 2022-03-05 DIAGNOSIS — I6523 Occlusion and stenosis of bilateral carotid arteries: Secondary | ICD-10-CM | POA: Diagnosis not present

## 2022-03-23 DIAGNOSIS — I739 Peripheral vascular disease, unspecified: Secondary | ICD-10-CM | POA: Diagnosis not present

## 2022-03-23 DIAGNOSIS — I10 Essential (primary) hypertension: Secondary | ICD-10-CM | POA: Diagnosis not present

## 2022-03-23 DIAGNOSIS — E785 Hyperlipidemia, unspecified: Secondary | ICD-10-CM | POA: Diagnosis not present

## 2022-04-23 DIAGNOSIS — I10 Essential (primary) hypertension: Secondary | ICD-10-CM | POA: Diagnosis not present

## 2022-04-23 DIAGNOSIS — E785 Hyperlipidemia, unspecified: Secondary | ICD-10-CM | POA: Diagnosis not present

## 2022-04-23 DIAGNOSIS — I739 Peripheral vascular disease, unspecified: Secondary | ICD-10-CM | POA: Diagnosis not present

## 2022-09-22 DIAGNOSIS — Z125 Encounter for screening for malignant neoplasm of prostate: Secondary | ICD-10-CM | POA: Diagnosis not present

## 2022-09-22 DIAGNOSIS — E785 Hyperlipidemia, unspecified: Secondary | ICD-10-CM | POA: Diagnosis not present

## 2022-09-25 DIAGNOSIS — Z6826 Body mass index (BMI) 26.0-26.9, adult: Secondary | ICD-10-CM | POA: Diagnosis not present

## 2022-09-25 DIAGNOSIS — E785 Hyperlipidemia, unspecified: Secondary | ICD-10-CM | POA: Diagnosis not present

## 2022-09-25 DIAGNOSIS — Z0000 Encounter for general adult medical examination without abnormal findings: Secondary | ICD-10-CM | POA: Diagnosis not present

## 2022-09-25 DIAGNOSIS — F4320 Adjustment disorder, unspecified: Secondary | ICD-10-CM | POA: Diagnosis not present

## 2022-09-25 DIAGNOSIS — I10 Essential (primary) hypertension: Secondary | ICD-10-CM | POA: Diagnosis not present

## 2022-12-08 DIAGNOSIS — I10 Essential (primary) hypertension: Secondary | ICD-10-CM | POA: Diagnosis not present

## 2022-12-08 DIAGNOSIS — Z6827 Body mass index (BMI) 27.0-27.9, adult: Secondary | ICD-10-CM | POA: Diagnosis not present

## 2022-12-08 DIAGNOSIS — M5432 Sciatica, left side: Secondary | ICD-10-CM | POA: Diagnosis not present

## 2022-12-11 DIAGNOSIS — M1612 Unilateral primary osteoarthritis, left hip: Secondary | ICD-10-CM | POA: Diagnosis not present

## 2022-12-12 DIAGNOSIS — M1612 Unilateral primary osteoarthritis, left hip: Secondary | ICD-10-CM | POA: Diagnosis not present

## 2022-12-18 DIAGNOSIS — M5136 Other intervertebral disc degeneration, lumbar region: Secondary | ICD-10-CM | POA: Diagnosis not present

## 2022-12-18 DIAGNOSIS — M9903 Segmental and somatic dysfunction of lumbar region: Secondary | ICD-10-CM | POA: Diagnosis not present

## 2022-12-18 DIAGNOSIS — M9902 Segmental and somatic dysfunction of thoracic region: Secondary | ICD-10-CM | POA: Diagnosis not present

## 2022-12-18 DIAGNOSIS — M9905 Segmental and somatic dysfunction of pelvic region: Secondary | ICD-10-CM | POA: Diagnosis not present

## 2022-12-19 DIAGNOSIS — M9905 Segmental and somatic dysfunction of pelvic region: Secondary | ICD-10-CM | POA: Diagnosis not present

## 2022-12-19 DIAGNOSIS — M5136 Other intervertebral disc degeneration, lumbar region: Secondary | ICD-10-CM | POA: Diagnosis not present

## 2022-12-19 DIAGNOSIS — M9903 Segmental and somatic dysfunction of lumbar region: Secondary | ICD-10-CM | POA: Diagnosis not present

## 2022-12-19 DIAGNOSIS — M9902 Segmental and somatic dysfunction of thoracic region: Secondary | ICD-10-CM | POA: Diagnosis not present

## 2022-12-22 DIAGNOSIS — M9902 Segmental and somatic dysfunction of thoracic region: Secondary | ICD-10-CM | POA: Diagnosis not present

## 2022-12-22 DIAGNOSIS — M5136 Other intervertebral disc degeneration, lumbar region: Secondary | ICD-10-CM | POA: Diagnosis not present

## 2022-12-22 DIAGNOSIS — M9903 Segmental and somatic dysfunction of lumbar region: Secondary | ICD-10-CM | POA: Diagnosis not present

## 2022-12-22 DIAGNOSIS — M9905 Segmental and somatic dysfunction of pelvic region: Secondary | ICD-10-CM | POA: Diagnosis not present

## 2022-12-24 DIAGNOSIS — M5136 Other intervertebral disc degeneration, lumbar region: Secondary | ICD-10-CM | POA: Diagnosis not present

## 2022-12-24 DIAGNOSIS — M9902 Segmental and somatic dysfunction of thoracic region: Secondary | ICD-10-CM | POA: Diagnosis not present

## 2022-12-24 DIAGNOSIS — M9903 Segmental and somatic dysfunction of lumbar region: Secondary | ICD-10-CM | POA: Diagnosis not present

## 2022-12-24 DIAGNOSIS — M9905 Segmental and somatic dysfunction of pelvic region: Secondary | ICD-10-CM | POA: Diagnosis not present

## 2022-12-25 DIAGNOSIS — M9902 Segmental and somatic dysfunction of thoracic region: Secondary | ICD-10-CM | POA: Diagnosis not present

## 2022-12-25 DIAGNOSIS — M5136 Other intervertebral disc degeneration, lumbar region: Secondary | ICD-10-CM | POA: Diagnosis not present

## 2022-12-25 DIAGNOSIS — M25552 Pain in left hip: Secondary | ICD-10-CM | POA: Diagnosis not present

## 2022-12-25 DIAGNOSIS — M9905 Segmental and somatic dysfunction of pelvic region: Secondary | ICD-10-CM | POA: Diagnosis not present

## 2022-12-25 DIAGNOSIS — M9903 Segmental and somatic dysfunction of lumbar region: Secondary | ICD-10-CM | POA: Diagnosis not present

## 2022-12-25 DIAGNOSIS — M1990 Unspecified osteoarthritis, unspecified site: Secondary | ICD-10-CM | POA: Diagnosis not present

## 2022-12-25 DIAGNOSIS — Z6826 Body mass index (BMI) 26.0-26.9, adult: Secondary | ICD-10-CM | POA: Diagnosis not present

## 2022-12-25 DIAGNOSIS — M5432 Sciatica, left side: Secondary | ICD-10-CM | POA: Diagnosis not present

## 2022-12-29 DIAGNOSIS — M9905 Segmental and somatic dysfunction of pelvic region: Secondary | ICD-10-CM | POA: Diagnosis not present

## 2022-12-29 DIAGNOSIS — M9902 Segmental and somatic dysfunction of thoracic region: Secondary | ICD-10-CM | POA: Diagnosis not present

## 2022-12-29 DIAGNOSIS — M9903 Segmental and somatic dysfunction of lumbar region: Secondary | ICD-10-CM | POA: Diagnosis not present

## 2022-12-29 DIAGNOSIS — M5136 Other intervertebral disc degeneration, lumbar region: Secondary | ICD-10-CM | POA: Diagnosis not present

## 2022-12-31 DIAGNOSIS — M9902 Segmental and somatic dysfunction of thoracic region: Secondary | ICD-10-CM | POA: Diagnosis not present

## 2022-12-31 DIAGNOSIS — M9905 Segmental and somatic dysfunction of pelvic region: Secondary | ICD-10-CM | POA: Diagnosis not present

## 2022-12-31 DIAGNOSIS — M5136 Other intervertebral disc degeneration, lumbar region: Secondary | ICD-10-CM | POA: Diagnosis not present

## 2022-12-31 DIAGNOSIS — M9903 Segmental and somatic dysfunction of lumbar region: Secondary | ICD-10-CM | POA: Diagnosis not present

## 2023-01-05 DIAGNOSIS — M9902 Segmental and somatic dysfunction of thoracic region: Secondary | ICD-10-CM | POA: Diagnosis not present

## 2023-01-05 DIAGNOSIS — M9905 Segmental and somatic dysfunction of pelvic region: Secondary | ICD-10-CM | POA: Diagnosis not present

## 2023-01-05 DIAGNOSIS — M5136 Other intervertebral disc degeneration, lumbar region: Secondary | ICD-10-CM | POA: Diagnosis not present

## 2023-01-05 DIAGNOSIS — M9903 Segmental and somatic dysfunction of lumbar region: Secondary | ICD-10-CM | POA: Diagnosis not present

## 2023-01-07 DIAGNOSIS — M9903 Segmental and somatic dysfunction of lumbar region: Secondary | ICD-10-CM | POA: Diagnosis not present

## 2023-01-07 DIAGNOSIS — M9902 Segmental and somatic dysfunction of thoracic region: Secondary | ICD-10-CM | POA: Diagnosis not present

## 2023-01-07 DIAGNOSIS — M5136 Other intervertebral disc degeneration, lumbar region: Secondary | ICD-10-CM | POA: Diagnosis not present

## 2023-01-07 DIAGNOSIS — M9905 Segmental and somatic dysfunction of pelvic region: Secondary | ICD-10-CM | POA: Diagnosis not present

## 2023-01-08 DIAGNOSIS — M5136 Other intervertebral disc degeneration, lumbar region: Secondary | ICD-10-CM | POA: Diagnosis not present

## 2023-01-08 DIAGNOSIS — M9903 Segmental and somatic dysfunction of lumbar region: Secondary | ICD-10-CM | POA: Diagnosis not present

## 2023-01-08 DIAGNOSIS — M9902 Segmental and somatic dysfunction of thoracic region: Secondary | ICD-10-CM | POA: Diagnosis not present

## 2023-01-08 DIAGNOSIS — M9905 Segmental and somatic dysfunction of pelvic region: Secondary | ICD-10-CM | POA: Diagnosis not present

## 2023-01-14 DIAGNOSIS — M9903 Segmental and somatic dysfunction of lumbar region: Secondary | ICD-10-CM | POA: Diagnosis not present

## 2023-01-14 DIAGNOSIS — M9902 Segmental and somatic dysfunction of thoracic region: Secondary | ICD-10-CM | POA: Diagnosis not present

## 2023-01-14 DIAGNOSIS — M9905 Segmental and somatic dysfunction of pelvic region: Secondary | ICD-10-CM | POA: Diagnosis not present

## 2023-01-14 DIAGNOSIS — M5136 Other intervertebral disc degeneration, lumbar region: Secondary | ICD-10-CM | POA: Diagnosis not present

## 2023-01-15 DIAGNOSIS — M9902 Segmental and somatic dysfunction of thoracic region: Secondary | ICD-10-CM | POA: Diagnosis not present

## 2023-01-15 DIAGNOSIS — M5136 Other intervertebral disc degeneration, lumbar region: Secondary | ICD-10-CM | POA: Diagnosis not present

## 2023-01-15 DIAGNOSIS — M9903 Segmental and somatic dysfunction of lumbar region: Secondary | ICD-10-CM | POA: Diagnosis not present

## 2023-01-15 DIAGNOSIS — M9905 Segmental and somatic dysfunction of pelvic region: Secondary | ICD-10-CM | POA: Diagnosis not present

## 2023-01-19 DIAGNOSIS — M9902 Segmental and somatic dysfunction of thoracic region: Secondary | ICD-10-CM | POA: Diagnosis not present

## 2023-01-19 DIAGNOSIS — M9905 Segmental and somatic dysfunction of pelvic region: Secondary | ICD-10-CM | POA: Diagnosis not present

## 2023-01-19 DIAGNOSIS — M5136 Other intervertebral disc degeneration, lumbar region: Secondary | ICD-10-CM | POA: Diagnosis not present

## 2023-01-19 DIAGNOSIS — M9903 Segmental and somatic dysfunction of lumbar region: Secondary | ICD-10-CM | POA: Diagnosis not present

## 2023-01-26 DIAGNOSIS — M47816 Spondylosis without myelopathy or radiculopathy, lumbar region: Secondary | ICD-10-CM | POA: Diagnosis not present

## 2023-02-18 DIAGNOSIS — M5136 Other intervertebral disc degeneration, lumbar region: Secondary | ICD-10-CM | POA: Diagnosis not present

## 2023-02-18 DIAGNOSIS — M9902 Segmental and somatic dysfunction of thoracic region: Secondary | ICD-10-CM | POA: Diagnosis not present

## 2023-02-18 DIAGNOSIS — M9903 Segmental and somatic dysfunction of lumbar region: Secondary | ICD-10-CM | POA: Diagnosis not present

## 2023-02-18 DIAGNOSIS — M9905 Segmental and somatic dysfunction of pelvic region: Secondary | ICD-10-CM | POA: Diagnosis not present

## 2023-03-18 DIAGNOSIS — M9902 Segmental and somatic dysfunction of thoracic region: Secondary | ICD-10-CM | POA: Diagnosis not present

## 2023-03-18 DIAGNOSIS — M5136 Other intervertebral disc degeneration, lumbar region: Secondary | ICD-10-CM | POA: Diagnosis not present

## 2023-03-18 DIAGNOSIS — M9903 Segmental and somatic dysfunction of lumbar region: Secondary | ICD-10-CM | POA: Diagnosis not present

## 2023-03-18 DIAGNOSIS — M9905 Segmental and somatic dysfunction of pelvic region: Secondary | ICD-10-CM | POA: Diagnosis not present

## 2023-03-31 ENCOUNTER — Other Ambulatory Visit: Payer: Self-pay | Admitting: *Deleted

## 2023-03-31 DIAGNOSIS — I6523 Occlusion and stenosis of bilateral carotid arteries: Secondary | ICD-10-CM

## 2023-04-02 DIAGNOSIS — L820 Inflamed seborrheic keratosis: Secondary | ICD-10-CM | POA: Diagnosis not present

## 2023-04-02 DIAGNOSIS — D485 Neoplasm of uncertain behavior of skin: Secondary | ICD-10-CM | POA: Diagnosis not present

## 2023-04-02 DIAGNOSIS — L814 Other melanin hyperpigmentation: Secondary | ICD-10-CM | POA: Diagnosis not present

## 2023-04-02 DIAGNOSIS — Z85828 Personal history of other malignant neoplasm of skin: Secondary | ICD-10-CM | POA: Diagnosis not present

## 2023-04-02 DIAGNOSIS — L821 Other seborrheic keratosis: Secondary | ICD-10-CM | POA: Diagnosis not present

## 2023-04-02 DIAGNOSIS — L720 Epidermal cyst: Secondary | ICD-10-CM | POA: Diagnosis not present

## 2023-04-02 DIAGNOSIS — C44519 Basal cell carcinoma of skin of other part of trunk: Secondary | ICD-10-CM | POA: Diagnosis not present

## 2023-04-02 DIAGNOSIS — L570 Actinic keratosis: Secondary | ICD-10-CM | POA: Diagnosis not present
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
## 2023-04-09 ENCOUNTER — Ambulatory Visit (HOSPITAL_COMMUNITY)
Admission: RE | Admit: 2023-04-09 | Discharge: 2023-04-09 | Disposition: A | Discharge status: Home / Self Care | Payer: Medicare Other | Source: Ambulatory Visit | Attending: Vascular Surgery | Admitting: Vascular Surgery

## 2023-04-09 ENCOUNTER — Encounter: Payer: Self-pay | Admitting: Physician Assistant

## 2023-04-09 ENCOUNTER — Ambulatory Visit (INDEPENDENT_AMBULATORY_CARE_PROVIDER_SITE_OTHER): Payer: Medicare Other | Admitting: Physician Assistant

## 2023-04-09 VITALS — BP 158/93 | HR 59 | Temp 98.0°F | Ht 70.0 in | Wt 189.9 lb

## 2023-04-09 DIAGNOSIS — I6523 Occlusion and stenosis of bilateral carotid arteries: Secondary | ICD-10-CM | POA: Diagnosis not present

## 2023-04-16 ENCOUNTER — Other Ambulatory Visit: Payer: Self-pay

## 2023-04-16 DIAGNOSIS — I6523 Occlusion and stenosis of bilateral carotid arteries: Secondary | ICD-10-CM

## 2023-07-30 DIAGNOSIS — Z23 Encounter for immunization: Secondary | ICD-10-CM | POA: Diagnosis not present

## 2023-10-06 DIAGNOSIS — L821 Other seborrheic keratosis: Secondary | ICD-10-CM | POA: Diagnosis not present

## 2023-10-06 DIAGNOSIS — L570 Actinic keratosis: Secondary | ICD-10-CM | POA: Diagnosis not present

## 2023-10-06 DIAGNOSIS — D1801 Hemangioma of skin and subcutaneous tissue: Secondary | ICD-10-CM | POA: Diagnosis not present

## 2023-10-06 DIAGNOSIS — L814 Other melanin hyperpigmentation: Secondary | ICD-10-CM | POA: Diagnosis not present

## 2023-10-06 DIAGNOSIS — L910 Hypertrophic scar: Secondary | ICD-10-CM | POA: Diagnosis not present

## 2023-10-06 DIAGNOSIS — Z85828 Personal history of other malignant neoplasm of skin: Secondary | ICD-10-CM | POA: Diagnosis not present

## 2023-10-06 DIAGNOSIS — L720 Epidermal cyst: Secondary | ICD-10-CM | POA: Diagnosis not present

## 2023-10-07 DIAGNOSIS — Z125 Encounter for screening for malignant neoplasm of prostate: Secondary | ICD-10-CM | POA: Diagnosis not present

## 2023-10-07 DIAGNOSIS — F4320 Adjustment disorder, unspecified: Secondary | ICD-10-CM | POA: Diagnosis not present

## 2023-10-07 DIAGNOSIS — Z9181 History of falling: Secondary | ICD-10-CM | POA: Diagnosis not present

## 2023-10-07 DIAGNOSIS — I10 Essential (primary) hypertension: Secondary | ICD-10-CM | POA: Diagnosis not present

## 2023-10-07 DIAGNOSIS — E785 Hyperlipidemia, unspecified: Secondary | ICD-10-CM | POA: Diagnosis not present

## 2023-10-07 DIAGNOSIS — Z0000 Encounter for general adult medical examination without abnormal findings: Secondary | ICD-10-CM | POA: Diagnosis not present

## 2023-10-07 DIAGNOSIS — Z79899 Other long term (current) drug therapy: Secondary | ICD-10-CM | POA: Diagnosis not present

## 2023-12-24 DIAGNOSIS — K080 Exfoliation of teeth due to systemic causes: Secondary | ICD-10-CM | POA: Diagnosis not present

## 2024-01-08 DIAGNOSIS — E785 Hyperlipidemia, unspecified: Secondary | ICD-10-CM | POA: Diagnosis not present

## 2024-01-08 DIAGNOSIS — J329 Chronic sinusitis, unspecified: Secondary | ICD-10-CM | POA: Diagnosis not present

## 2024-01-08 DIAGNOSIS — Z6827 Body mass index (BMI) 27.0-27.9, adult: Secondary | ICD-10-CM | POA: Diagnosis not present

## 2024-01-08 DIAGNOSIS — J40 Bronchitis, not specified as acute or chronic: Secondary | ICD-10-CM | POA: Diagnosis not present

## 2024-01-26 DIAGNOSIS — K080 Exfoliation of teeth due to systemic causes: Secondary | ICD-10-CM | POA: Diagnosis not present

## 2024-02-03 ENCOUNTER — Encounter: Payer: Self-pay | Admitting: Pharmacist

## 2024-02-03 NOTE — Progress Notes (Signed)
   02/03/2024  Patient ID: Jose Wolf, male   DOB: 05-13-1951, 73 y.o.   MRN: [id number]  Referral received through [facility] EMR for assistance with Repatha.   Will send to [facility] patient advocate for further evaluation.

## 2024-02-04 ENCOUNTER — Telehealth: Payer: Self-pay

## 2024-02-04 ENCOUNTER — Other Ambulatory Visit (HOSPITAL_COMMUNITY): Payer: Self-pay

## 2024-02-04 NOTE — Telephone Encounter (Signed)
 Patient Advocate Encounter   The patient was approved for a [employer] grant that will help cover the cost of Repatha Total amount awarded, $2,500.  Effective: 01/05/2024 - 12/25/2024   UJW:[id number] [id number] MVHQI:[id number]   Pharmacy provided with approval and processing information. Patient informed via Phone Call  Tresea Mall, CPHT/Patient Advocate [place] Direct Line: [phone] Fax: [phone]

## 2024-02-04 NOTE — Telephone Encounter (Signed)
 Pharmacy Patient Advocate Encounter  Insurance verification completed.   The patient is insured through [employer]    Ran test claim for Repatha. Currently a quantity of 2 is a 28 day supply and the co-pay is $420.00 . Applying for [employer] to cover copay cost  This test claim was processed through [facility]- copay amounts may vary at other pharmacies due to pharmacy/plan contracts, or as the patient moves through the different stages of their insurance plan.

## 2024-02-08 ENCOUNTER — Other Ambulatory Visit (HOSPITAL_COMMUNITY): Payer: Self-pay

## 2024-02-08 ENCOUNTER — Other Ambulatory Visit: Payer: Self-pay

## 2024-02-08 ENCOUNTER — Encounter: Payer: Self-pay | Admitting: Pharmacist

## 2024-02-08 MED ORDER — REPATHA 140 MG/ML ~~LOC~~ SOSY
140.0000 mg | PREFILLED_SYRINGE | SUBCUTANEOUS | 3 refills | Status: AC
  Filled 2024-02-08: qty 2, 28d supply, fill #0

## 2024-02-08 NOTE — Progress Notes (Addendum)
   02/08/2024  Patient ID: Jose Wolf, male   DOB: 02/24/1951, 73 y.o.   MRN: [id number]  [name] stopped by office today. [facility] that [facility] cost to him would be $400. I advised patient that I could call and instruct [facility] how to process the [employer] grant he has been approved for.  Patient discussed that he historically received Repatha through mail delivery to his door every 90 days. Informed patient I could have prescription sent to [facility] for delivery to his home.  Patient confirmed that he would like to have this started.  Coordinated this with outpatient pharmacy today.   Of note, patient had to stop the Repatha previously due to cost. LDL went from 86 mg/dL (27/25/3664) on Repatha to 131 mg/dL (40/34/7425) off Repatha. Patient is appreciative to be able to go back on therapy.  Reynold Bowen, PharmD Clinical Pharmacist [place] Direct Dial: [phone]

## 2024-02-10 ENCOUNTER — Telehealth (HOSPITAL_COMMUNITY): Payer: Self-pay | Admitting: Pharmacist

## 2024-02-10 NOTE — Telephone Encounter (Signed)
 Confirmed with patient, received Repatha yesterday from [facility] pharmacy. Patient received a 28 day supply.   Reynold Bowen, PharmD Clinical Pharmacist [place] Direct Dial: [phone]

## 2024-02-16 DIAGNOSIS — K080 Exfoliation of teeth due to systemic causes: Secondary | ICD-10-CM | POA: Diagnosis not present

## 2024-03-24 ENCOUNTER — Other Ambulatory Visit: Payer: Self-pay | Admitting: Pharmacist

## 2024-03-24 NOTE — Progress Notes (Signed)
   03/24/2024  Patient ID: Jose Wolf, male   DOB: Apr 11, 1951, 73 y.o.   MRN: [id number]  Jose Wolf came to office for face to face consultation regarding Repatha  Injection. Patient received a pre filled syringe from pharmacy and is not comfortable injecting. Patient prefers to use the autoinjector. Patient presented a letter he received from [facility] stating there is a quantity limit and will cover 30 day supplies at a time.    Coordinated an updated prescription for the Repatha  SureClick autoinjecter to [facility]. Medication requires a PA which has been sent over to Dr. [place] Cid nurse today.  Patient has been advised.   Calvert Caul, PharmD Clinical Pharmacist  Direct Dial: [phone]

## 2024-03-25 ENCOUNTER — Other Ambulatory Visit (HOSPITAL_COMMUNITY): Payer: Self-pay

## 2024-03-25 MED ORDER — REPATHA SURECLICK 140 MG/ML ~~LOC~~ SOAJ
140.0000 mg | SUBCUTANEOUS | 12 refills | Status: AC
  Filled 2024-03-25 – 2024-03-29 (×3): qty 2, 28d supply, fill #0
  Filled 2024-05-02: qty 2, 28d supply, fill #1
  Filled 2024-06-22: qty 2, 28d supply, fill #2
  Filled 2024-07-24: qty 2, 28d supply, fill #3
  Filled 2024-08-23: qty 2, 28d supply, fill #4
  Filled 2024-09-21: qty 2, 28d supply, fill #5
  Filled 2024-10-22: qty 2, 28d supply, fill #6
  Filled 2024-12-05: qty 2, 28d supply, fill #7

## 2024-03-29 ENCOUNTER — Other Ambulatory Visit (HOSPITAL_COMMUNITY): Payer: Self-pay

## 2024-03-29 ENCOUNTER — Other Ambulatory Visit: Payer: Self-pay

## 2024-03-30 ENCOUNTER — Telehealth: Payer: Self-pay | Admitting: Pharmacist

## 2024-03-30 ENCOUNTER — Other Ambulatory Visit (HOSPITAL_COMMUNITY): Payer: Self-pay

## 2024-03-30 NOTE — Progress Notes (Signed)
   03/30/2024  Patient ID: Jose Wolf, male   DOB: [birth year], 73 y.o.   MRN: [id number]   Telephonic engagement with Jose Wolf today. Advised that he will be receiving the Repatha  Auto Injector from [facility]. Advised on refill instructions by calling number on the label and providing prescription number at least 7 days prior to needing a refill. Patient appreciative of call.   Calvert Caul, PharmD Clinical Pharmacist [place] Direct Dial: [phone]

## 2024-04-11 DIAGNOSIS — Z85828 Personal history of other malignant neoplasm of skin: Secondary | ICD-10-CM | POA: Diagnosis not present

## 2024-04-11 DIAGNOSIS — L570 Actinic keratosis: Secondary | ICD-10-CM | POA: Diagnosis not present

## 2024-04-11 DIAGNOSIS — D225 Melanocytic nevi of trunk: Secondary | ICD-10-CM | POA: Diagnosis not present

## 2024-04-11 DIAGNOSIS — L814 Other melanin hyperpigmentation: Secondary | ICD-10-CM | POA: Diagnosis not present

## 2024-04-11 DIAGNOSIS — L720 Epidermal cyst: Secondary | ICD-10-CM | POA: Diagnosis not present

## 2024-04-21 ENCOUNTER — Other Ambulatory Visit (HOSPITAL_BASED_OUTPATIENT_CLINIC_OR_DEPARTMENT_OTHER): Payer: Self-pay

## 2024-04-21 ENCOUNTER — Encounter (HOSPITAL_BASED_OUTPATIENT_CLINIC_OR_DEPARTMENT_OTHER): Payer: Self-pay

## 2024-04-21 ENCOUNTER — Ambulatory Visit (HOSPITAL_BASED_OUTPATIENT_CLINIC_OR_DEPARTMENT_OTHER)
Admission: EM | Admit: 2024-04-21 | Discharge: 2024-04-21 | Disposition: A | Discharge status: Home / Self Care | Attending: Family Medicine | Admitting: Family Medicine

## 2024-04-21 DIAGNOSIS — J0110 Acute frontal sinusitis, unspecified: Secondary | ICD-10-CM | POA: Diagnosis not present

## 2024-04-21 MED ORDER — AMOXICILLIN 500 MG PO CAPS
1000.0000 mg | ORAL_CAPSULE | Freq: Three times a day (TID) | ORAL | 0 refills | Status: AC
  Filled 2024-04-21: qty 42, 7d supply, fill #0

## 2024-04-21 NOTE — Discharge Instructions (Signed)
 Take the antibiotic as per for a sinus infection.  You can take over-the-counter Mucinex  for congestion as needed Follow-up as needed

## 2024-04-21 NOTE — ED Triage Notes (Signed)
 Onset of nasal congestion suddenly last night with temp of 102.  Afebrile today. States , "I think it's a head cold."
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
## 2024-04-24 ENCOUNTER — Ambulatory Visit (HOSPITAL_BASED_OUTPATIENT_CLINIC_OR_DEPARTMENT_OTHER)
Admission: EM | Admit: 2024-04-24 | Discharge: 2024-04-24 | Disposition: A | Discharge status: Home / Self Care | Attending: Family Medicine | Admitting: Family Medicine

## 2024-04-24 ENCOUNTER — Encounter (HOSPITAL_BASED_OUTPATIENT_CLINIC_OR_DEPARTMENT_OTHER): Payer: Self-pay | Admitting: Emergency Medicine

## 2024-04-24 DIAGNOSIS — J3489 Other specified disorders of nose and nasal sinuses: Secondary | ICD-10-CM | POA: Diagnosis not present

## 2024-04-24 DIAGNOSIS — J0140 Acute pansinusitis, unspecified: Secondary | ICD-10-CM | POA: Diagnosis not present

## 2024-04-24 MED ORDER — METHYLPREDNISOLONE ACETATE 40 MG/ML IJ SUSP
40.0000 mg | Freq: Once | INTRAMUSCULAR | Status: AC
  Administered 2024-04-24: 40 mg via INTRAMUSCULAR

## 2024-04-24 NOTE — ED Triage Notes (Signed)
 Pt was seen here on 05/29 was prescribed Amoxicillin  he seems to not be getting any better his son has the same symptoms and had a Kenalog shot and is better.
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
## 2024-04-24 NOTE — Discharge Instructions (Signed)
 Sinusitis and sinus pressure pain: Continue amoxicillin , 500 mg, 2 pills 3 times daily until the prescription is finished.  Do not stop early.  Encouraged over-the-counter probiotics to take while you are taking the antibiotics to reduce risk of antibiotic related diarrhea.  Get plenty of fluids and rest.  Depo-Medrol, 40 mg, injection in the office today (this is a steroid injection).  Follow-up if symptoms do not improve, worsen or new symptoms occur.

## 2024-05-02 ENCOUNTER — Other Ambulatory Visit (HOSPITAL_COMMUNITY): Payer: Self-pay

## 2024-05-25 ENCOUNTER — Other Ambulatory Visit: Payer: Self-pay | Admitting: *Deleted

## 2024-05-25 DIAGNOSIS — I6523 Occlusion and stenosis of bilateral carotid arteries: Secondary | ICD-10-CM

## 2024-06-08 ENCOUNTER — Ambulatory Visit (HOSPITAL_COMMUNITY): Admission: RE | Admit: 2024-06-08 | Source: Ambulatory Visit

## 2024-06-08 ENCOUNTER — Ambulatory Visit: Attending: Family Medicine

## 2024-06-15 DIAGNOSIS — Z6826 Body mass index (BMI) 26.0-26.9, adult: Secondary | ICD-10-CM | POA: Diagnosis not present

## 2024-06-15 DIAGNOSIS — L255 Unspecified contact dermatitis due to plants, except food: Secondary | ICD-10-CM | POA: Diagnosis not present

## 2024-06-22 ENCOUNTER — Other Ambulatory Visit: Payer: Self-pay

## 2024-06-22 ENCOUNTER — Other Ambulatory Visit (HOSPITAL_COMMUNITY): Payer: Self-pay

## 2024-06-23 ENCOUNTER — Other Ambulatory Visit: Payer: Self-pay

## 2024-07-22 ENCOUNTER — Other Ambulatory Visit: Payer: Self-pay

## 2024-07-22 ENCOUNTER — Other Ambulatory Visit (HOSPITAL_COMMUNITY): Payer: Self-pay

## 2024-07-22 MED ORDER — LOSARTAN POTASSIUM 100 MG PO TABS
100.0000 mg | ORAL_TABLET | Freq: Every day | ORAL | 0 refills | Status: DC
  Filled 2024-07-22: qty 90, 90d supply, fill #0

## 2024-07-24 ENCOUNTER — Other Ambulatory Visit (HOSPITAL_COMMUNITY): Payer: Self-pay

## 2024-07-25 ENCOUNTER — Other Ambulatory Visit: Payer: Self-pay

## 2024-07-25 ENCOUNTER — Other Ambulatory Visit (HOSPITAL_COMMUNITY): Payer: Self-pay

## 2024-07-27 ENCOUNTER — Encounter: Payer: Self-pay | Admitting: Physician Assistant

## 2024-07-27 ENCOUNTER — Ambulatory Visit: Attending: Physician Assistant | Admitting: Physician Assistant

## 2024-07-27 ENCOUNTER — Ambulatory Visit (HOSPITAL_COMMUNITY)
Admission: RE | Admit: 2024-07-27 | Discharge: 2024-07-27 | Disposition: A | Discharge status: Home / Self Care | Source: Ambulatory Visit | Attending: Physician Assistant | Admitting: Physician Assistant

## 2024-07-27 VITALS — BP 99/65 | HR 72 | Temp 97.9°F | Wt 183.5 lb

## 2024-07-27 DIAGNOSIS — I6523 Occlusion and stenosis of bilateral carotid arteries: Secondary | ICD-10-CM | POA: Diagnosis not present
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
## 2024-07-28 ENCOUNTER — Other Ambulatory Visit (HOSPITAL_COMMUNITY): Payer: Self-pay

## 2024-07-28 DIAGNOSIS — E785 Hyperlipidemia, unspecified: Secondary | ICD-10-CM | POA: Diagnosis not present

## 2024-07-28 DIAGNOSIS — Z789 Other specified health status: Secondary | ICD-10-CM | POA: Diagnosis not present

## 2024-07-28 DIAGNOSIS — F4320 Adjustment disorder, unspecified: Secondary | ICD-10-CM | POA: Diagnosis not present

## 2024-07-28 DIAGNOSIS — I10 Essential (primary) hypertension: Secondary | ICD-10-CM | POA: Diagnosis not present

## 2024-07-28 MED ORDER — LOSARTAN POTASSIUM 100 MG PO TABS: 100.0000 mg | ORAL_TABLET | Freq: Every day | ORAL | 4 refills | Status: AC

## 2024-07-28 MED ORDER — AMLODIPINE BESYLATE 5 MG PO TABS
5.0000 mg | ORAL_TABLET | Freq: Every day | ORAL | 4 refills | Status: AC
  Filled 2024-07-28: qty 90, 90d supply, fill #0

## 2024-07-28 MED ORDER — BUPROPION HCL ER (XL) 150 MG PO TB24
150.0000 mg | ORAL_TABLET | Freq: Every morning | ORAL | 3 refills | Status: AC
  Filled 2024-07-28: qty 90, 90d supply, fill #0

## 2024-08-03 ENCOUNTER — Ambulatory Visit

## 2024-08-03 ENCOUNTER — Encounter (HOSPITAL_COMMUNITY)

## 2024-08-23 ENCOUNTER — Other Ambulatory Visit (HOSPITAL_COMMUNITY): Payer: Self-pay

## 2024-09-21 ENCOUNTER — Other Ambulatory Visit (HOSPITAL_COMMUNITY): Payer: Self-pay

## 2024-10-12 DIAGNOSIS — K080 Exfoliation of teeth due to systemic causes: Secondary | ICD-10-CM | POA: Diagnosis not present

## 2024-10-17 DIAGNOSIS — D225 Melanocytic nevi of trunk: Secondary | ICD-10-CM | POA: Diagnosis not present

## 2024-10-17 DIAGNOSIS — L814 Other melanin hyperpigmentation: Secondary | ICD-10-CM | POA: Diagnosis not present

## 2024-10-17 DIAGNOSIS — L720 Epidermal cyst: Secondary | ICD-10-CM | POA: Diagnosis not present

## 2024-10-17 DIAGNOSIS — L570 Actinic keratosis: Secondary | ICD-10-CM | POA: Diagnosis not present

## 2024-10-17 DIAGNOSIS — Z85828 Personal history of other malignant neoplasm of skin: Secondary | ICD-10-CM | POA: Diagnosis not present

## 2024-10-17 DIAGNOSIS — L820 Inflamed seborrheic keratosis: Secondary | ICD-10-CM | POA: Diagnosis not present

## 2024-10-18 DIAGNOSIS — K080 Exfoliation of teeth due to systemic causes: Secondary | ICD-10-CM | POA: Diagnosis not present

## 2024-10-22 ENCOUNTER — Other Ambulatory Visit (HOSPITAL_COMMUNITY): Payer: Self-pay

## 2024-10-31 ENCOUNTER — Other Ambulatory Visit (HOSPITAL_COMMUNITY): Payer: Self-pay

## 2024-10-31 ENCOUNTER — Other Ambulatory Visit: Payer: Self-pay

## 2024-10-31 MED ORDER — LOSARTAN POTASSIUM 100 MG PO TABS
100.0000 mg | ORAL_TABLET | Freq: Every day | ORAL | 0 refills | Status: AC
  Filled 2024-10-31 (×2): qty 90, 90d supply, fill #0

## 2024-10-31 MED ORDER — BUPROPION HCL ER (XL) 150 MG PO TB24
150.0000 mg | ORAL_TABLET | Freq: Every morning | ORAL | 0 refills | Status: AC
  Filled 2024-10-31: qty 90, 90d supply, fill #0

## 2024-11-01 ENCOUNTER — Other Ambulatory Visit (HOSPITAL_COMMUNITY): Payer: Self-pay

## 2024-11-01 ENCOUNTER — Other Ambulatory Visit: Payer: Self-pay

## 2024-12-01 ENCOUNTER — Ambulatory Visit (HOSPITAL_BASED_OUTPATIENT_CLINIC_OR_DEPARTMENT_OTHER)
Admission: EM | Admit: 2024-12-01 | Discharge: 2024-12-01 | Disposition: A | Discharge status: Home / Self Care | Source: Ambulatory Visit | Attending: Family Medicine | Admitting: Family Medicine

## 2024-12-01 ENCOUNTER — Other Ambulatory Visit (HOSPITAL_BASED_OUTPATIENT_CLINIC_OR_DEPARTMENT_OTHER): Payer: Self-pay

## 2024-12-01 ENCOUNTER — Encounter (HOSPITAL_BASED_OUTPATIENT_CLINIC_OR_DEPARTMENT_OTHER): Payer: Self-pay

## 2024-12-01 DIAGNOSIS — J0191 Acute recurrent sinusitis, unspecified: Secondary | ICD-10-CM

## 2024-12-01 MED ORDER — TRIAMCINOLONE ACETONIDE 40 MG/ML IJ SUSP
40.0000 mg | Freq: Once | INTRAMUSCULAR | Status: AC
  Administered 2024-12-01: 40 mg via INTRAMUSCULAR

## 2024-12-01 MED ORDER — AMOXICILLIN 875 MG PO TABS
875.0000 mg | ORAL_TABLET | Freq: Two times a day (BID) | ORAL | 0 refills | Status: AC
  Filled 2024-12-01: qty 14, 7d supply, fill #0

## 2024-12-01 NOTE — Discharge Instructions (Signed)
 Treating you for a sinus infection.  Take the amoxicillin  as prescribed.  Steroid injection given here today.  Continue over-the-counter medications as needed.  Follow-up as needed

## 2024-12-01 NOTE — ED Triage Notes (Signed)
 Onset 7 days ago of nasal congestion and cough. Eyes burning. Denies ear pain. +sore throat. States intermittent fevers. Hoarse voice. States zpacks don't work well for him.
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
## 2024-12-05 ENCOUNTER — Other Ambulatory Visit (HOSPITAL_COMMUNITY): Payer: Self-pay
